# Patient Record
Sex: Female | Born: 1960
Health system: Southern US, Community
[De-identification: ages and names within clinical notes are randomized; demographics above are authoritative.]

## PROBLEM LIST (undated history)

## (undated) DIAGNOSIS — G4730 Sleep apnea, unspecified: Secondary | ICD-10-CM

## (undated) DIAGNOSIS — I10 Essential (primary) hypertension: Secondary | ICD-10-CM

## (undated) DIAGNOSIS — M1990 Unspecified osteoarthritis, unspecified site: Secondary | ICD-10-CM

## (undated) DIAGNOSIS — J302 Other seasonal allergic rhinitis: Secondary | ICD-10-CM

## (undated) DIAGNOSIS — J45909 Unspecified asthma, uncomplicated: Secondary | ICD-10-CM

## (undated) HISTORY — PX: OTHER SURGICAL HISTORY: SHX169

---

## 2014-04-15 ENCOUNTER — Other Ambulatory Visit: Payer: Self-pay | Admitting: Family Medicine

## 2014-04-15 DIAGNOSIS — Z1231 Encounter for screening mammogram for malignant neoplasm of breast: Secondary | ICD-10-CM

## 2014-06-03 ENCOUNTER — Emergency Department (HOSPITAL_COMMUNITY)
Admission: EM | Admit: 2014-06-03 | Discharge: 2014-06-03 | Disposition: A | Discharge status: Home / Self Care | Payer: Federal, State, Local not specified - PPO | Attending: Emergency Medicine | Admitting: Emergency Medicine

## 2014-06-03 DIAGNOSIS — R202 Paresthesia of skin: Secondary | ICD-10-CM | POA: Insufficient documentation

## 2014-06-03 DIAGNOSIS — Z79899 Other long term (current) drug therapy: Secondary | ICD-10-CM | POA: Diagnosis not present

## 2014-06-03 DIAGNOSIS — R42 Dizziness and giddiness: Secondary | ICD-10-CM

## 2014-06-03 LAB — CBG MONITORING, ED: Glucose-Capillary: 81 mg/dL (ref 70–99)
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
## 2014-06-03 NOTE — ED Notes (Addendum)
Pt states started having tingling in her lips and became dizzy this morning.  No chest pains.  Hx of multiple food allergies.  Pt had not eaten yet this morning.  No changes in swallowing or breathing.

## 2014-06-03 NOTE — Discharge Instructions (Signed)
Dizziness °Dizziness is a common problem. It is a feeling of unsteadiness or light-headedness. You may feel like you are about to faint. Dizziness can lead to injury if you stumble or fall. A person of any age group can suffer from dizziness, but dizziness is more common in older adults. °CAUSES  °Dizziness can be caused by many different things, including: °· Middle ear problems. °· Standing for too long. °· Infections. °· An allergic reaction. °· Aging. °· An emotional response to something, such as the sight of blood. °· Side effects of medicines. °· Tiredness. °· Problems with circulation or blood pressure. °· Excessive use of alcohol or medicines, or illegal drug use. °· Breathing too fast (hyperventilation). °· An irregular heart rhythm (arrhythmia). °· A low red blood cell count (anemia). °· Pregnancy. °· Vomiting, diarrhea, fever, or other illnesses that cause body fluid loss (dehydration). °· Diseases or conditions such as Parkinson's disease, high blood pressure (hypertension), diabetes, and thyroid problems. °· Exposure to extreme heat. °DIAGNOSIS  °Your health care provider will ask about your symptoms, perform a physical exam, and perform an electrocardiogram (ECG) to record the electrical activity of your heart. Your health care provider may also perform other heart or blood tests to determine the cause of your dizziness. These may include: °· Transthoracic echocardiogram (TTE). During echocardiography, sound waves are used to evaluate how blood flows through your heart. °· Transesophageal echocardiogram (TEE). °· Cardiac monitoring. This allows your health care provider to monitor your heart rate and rhythm in real time. °· Holter monitor. This is a portable device that records your heartbeat and can help diagnose heart arrhythmias. It allows your health care provider to track your heart activity for several days if needed. °· Stress tests by exercise or by giving medicine that makes the heart beat  faster. °TREATMENT  °Treatment of dizziness depends on the cause of your symptoms and can vary greatly. °HOME CARE INSTRUCTIONS  °· Drink enough fluids to keep your urine clear or pale yellow. This is especially important in very hot weather. In older adults, it is also important in cold weather. °· Take your medicine exactly as directed if your dizziness is caused by medicines. When taking blood pressure medicines, it is especially important to get up slowly. °· Rise slowly from chairs and steady yourself until you feel okay. °· In the morning, first sit up on the side of the bed. When you feel okay, stand slowly while holding onto something until you know your balance is fine. °· Move your legs often if you need to stand in one place for a long time. Tighten and relax your muscles in your legs while standing. °· Have someone stay with you for 1-2 days if dizziness continues to be a problem. Do this until you feel you are well enough to stay alone. Have the person call your health care provider if he or she notices changes in you that are concerning. °· Do not drive or use heavy machinery if you feel dizzy. °· Do not drink alcohol. °SEEK IMMEDIATE MEDICAL CARE IF:  °· Your dizziness or light-headedness gets worse. °· You feel nauseous or vomit. °· You have problems talking, walking, or using your arms, hands, or legs. °· You feel weak. °· You are not thinking clearly or you have trouble forming sentences. It may take a friend or family member to notice this. °· You have chest pain, abdominal pain, shortness of breath, or sweating. °· Your vision changes. °· You notice   any bleeding.  You have side effects from medicine that seems to be getting worse rather than better. MAKE SURE YOU:   Understand these instructions.  Will watch your condition.  Will get help right away if you are not doing well or get worse. Document Released: 07/06/2000 Document Revised: 01/15/2013 Document Reviewed: 07/30/2010 [facility]  Patient Information 2015 [place], [place]. This information is not intended to replace advice given to you by your health care provider. Make sure you discuss any questions you have with your health care provider. Paresthesia Paresthesia is an abnormal burning or prickling sensation. This sensation is generally felt in the hands, arms, legs, or feet. However, it may occur in any part of the body. It is usually not painful. The feeling may be described as:  Tingling or numbness.  "Pins and needles."  Skin crawling.  Buzzing.  Limbs "falling asleep."  Itching. Most people experience temporary (transient) paresthesia at some time in their lives. CAUSES  Paresthesia may occur when you breathe too quickly (hyperventilation). It can also occur without any apparent cause. Commonly, paresthesia occurs when pressure is placed on a nerve. The feeling quickly goes away once the pressure is removed. For some people, however, paresthesia is a long-lasting (chronic) condition caused by an underlying disorder. The underlying disorder may be:  A traumatic, direct injury to nerves. Examples include a:  Broken (fractured) neck.  Fractured skull.  A disorder affecting the brain and spinal cord (central nervous system). Examples include:  Transverse myelitis.  Encephalitis.  Transient ischemic attack.  Multiple sclerosis.  Stroke.  Tumor or blood vessel problems, such as an arteriovenous malformation pressing against the brain or spinal cord.  A condition that damages the peripheral nerves (peripheral neuropathy). Peripheral nerves are not part of the brain and spinal cord. These conditions include:  Diabetes.  Peripheral vascular disease.  Nerve entrapment syndromes, such as carpal tunnel syndrome.  Shingles.  Hypothyroidism.  Vitamin B12 deficiencies.  Alcoholism.  Heavy metal poisoning (lead, arsenic).  Rheumatoid arthritis.  Systemic lupus erythematosus. DIAGNOSIS  Your  caregiver will attempt to find the underlying cause of your paresthesia. Your caregiver may:  Take your medical history.  Perform a physical exam.  Order various lab tests.  Order imaging tests. TREATMENT  Treatment for paresthesia depends on the underlying cause. HOME CARE INSTRUCTIONS  Avoid drinking alcohol.  You may consider massage or acupuncture to help relieve your symptoms.  Keep all follow-up appointments as directed by your caregiver. SEEK IMMEDIATE MEDICAL CARE IF:   You feel weak.  You have trouble walking or moving.  You have problems with speech or vision.  You feel confused.  You cannot control your bladder or bowel movements.  You feel numbness after an injury.  You faint.  Your burning or prickling feeling gets worse when walking.  You have pain, cramps, or dizziness.  You develop a rash. MAKE SURE YOU:  Understand these instructions.  Will watch your condition.  Will get help right away if you are not doing well or get worse. Document Released: 12/31/2001 Document Revised: 04/04/2011 Document Reviewed: 10/01/2010 [facility] Patient Information 2015 [place]Care, [place]LLC. This information is not intended to replace advice given to you by your health care provider. Make sure you discuss any questions you have with your health care provider.  Emergency Department Resource Guide 1) Find a Doctor and Pay Out of Pocket Although you won't have to find out who is covered by your insurance plan, it is a good idea to ask  around and get recommendations. You will then need to call the office and see if the doctor you have chosen will accept you as a new patient and what types of options they offer for patients who are self-pay. Some doctors offer discounts or will set up payment plans for their patients who do not have insurance, but you will need to ask so you aren't surprised when you get to your appointment.  2) Contact Your Local Health Department Not all  health departments have doctors that can see patients for sick visits, but many do, so it is worth a call to see if yours does. If you don't know where your local health department is, you can check in your phone book. The CDC also has a tool to help you locate your state's health department, and many state websites also have listings of all of their local health departments.  3) Find a Walk-in Clinic If your illness is not likely to be very severe or complicated, you may want to try a walk in clinic. These are popping up all over the country in pharmacies, drugstores, and shopping centers. They're usually staffed by nurse practitioners or physician assistants that have been trained to treat common illnesses and complaints. They're usually fairly quick and inexpensive. However, if you have serious medical issues or chronic medical problems, these are probably not your best option.  No Primary Care Doctor: - Call Health Connect at  [phone] - they can help you locate a primary care doctor that  accepts your insurance, provides certain services, etc. - Physician Referral Service- [phone]  Chronic Pain Problems: Organization         Address  Phone   Notes  [facility]  [phone] Patients need to be referred by their primary care doctor.   Medication Assistance: Organization         Address  Phone   Notes  [facility] Medication [facility] [address] [place], [place] [address] [phone] --Must be a resident of [place] -- Must have NO insurance coverage whatsoever (no Medicaid/ Medicare, etc.) -- The pt. MUST have a primary care doctor that directs their care regularly and follows them in the community   Med[facility]ist  [phone]   [employer]  [phone]) [phone]    Agencies that provide inexpensive medical care: Organization         Address  Phone   Notes  [facility]  [phone]   [facility]     [phone]   [facility] [address] [place], [place] [address] [phone]   [facility] [address], [place] [phone]) [phone]   [facility]    [phone]   [facility]    [phone]   [facility]  [address], [place] Phone:  [phone], Fax:  [phone] Hours of Operation:  9 am - 6 pm, M-F.  Also accepts Medicaid/Medicare and self-pay.  [facility]  [address], [place] Phone: [phone], Fax: [phone]. Hours of Operation:  8:30 am - 5:30 pm, M-F.  Also accepts Medicaid and self-pay.  [facility] [place] [address], [place] Phone: [phone]) [phone]   [facility] [address], [place], [place] [phone], Ext. [phone] Mondays & Thursdays: 7-9 AM.  First 15 patients are seen  on a first come, first serve basis.    Medicaid-accepting [facility] Providers:  Organization         Address  Phone   Notes  [facility] [address], [place] [phone] Also accepts self-pay patients.  [facility] [address] Laurell Josephs [place], [place]  [phone]   [facility] [address], [place] [phone]   [facility] [address] [place] [phone]   Renaye Rakers [address], [place]   [phone] Only accepts [place] Access [place] patients after they have their name applied to their card.   Self-Pay (no insurance) in [facility]:  Organization         Address  Phone   Notes  Sickle Cell Patients, [facility] [address] [place], [place] [phone]   [facility] [address] [place], [place] [phone]   [facility] [place]  [address], [place] [phone]     [facility]/Dr. Osei-Bonsu  [address], [place] or [address], [place] [phone] Phone number for both [place] and [place] locations is the same.  [facility] [address], [place] [phone]   [facility] [address], [place] or [address] [phone]   [facility] [address], [place] [phone], phone; [phone], fax Sees patients 1st and 3rd Saturday of every month.  Must not qualify for public or private insurance (i.e. Medicaid, Medicare, [place] Health Choice, Veterans' Benefits)  Household income should be no more than 200% of the poverty level The clinic cannot treat you if you are pregnant or think you are pregnant  Sexually transmitted diseases are not treated at the clinic.    Dental Care: Organization         Address  Phone  Notes  [facility] Department of [facility] [address] [place], [place] [phone] Accepts children up to age 1 who are enrolled in [place] or [place] Health Choice; pregnant women with a Medicaid card; and children who have applied for Medicaid or [place] Health Choice, but were declined, whose parents can pay a reduced fee at time of service.  [facility] of [facility]  [address], [place] [phone] Accepts children up to age 74 who are enrolled in [place] or [place] Health Choice; pregnant women with a Medicaid card; and children who have applied for Medicaid or [place] Health Choice, but were declined, whose parents can pay a reduced fee at time of service.  [facility]  [address] [place], [place] [phone] Patients are seen by appointment only. Walk-ins are not accepted. [facility] will see patients 56 years of age and older. Monday - Tuesday (8am-5pm) Most Wednesdays (8:30-5pm) $30 per visit,  cash only  [facility] Adult Dental Access PROGRAM  [address], [facility] [phone] Patients are seen by appointment only. Walk-ins are not accepted. [facility] will see patients 98 years of age and older. One Wednesday Evening (Monthly: Volunteer Based).  $30 per visit, cash only  [employer]  [phone] for adults; Children under age 23, call Graduate Pediatric Dentistry at [phone])  [phone]. Children aged 44-14, please call [phone] to request a pediatric application.  Dental services are provided in all areas of dental care including fillings, crowns and bridges, complete and partial dentures, implants, gum treatment, root canals, and extractions. Preventive care is also provided. Treatment is provided to both adults and children. Patients are selected via a lottery and there is often a waiting list.   [facility] [address], [place]  [phone] [email or web address]   [facility] [address] [place], [place] [phone], Ext. [phone] Second and Fourth Thursday of each month, opens at 6:30 AM; Clinic ends at 9 AM.  Patients are seen on a first-come first-served basis, and a limited number are seen during each clinic.   [facility]  [address] Ether Griffins [place], [place] [phone]   Eligibility Requirements You must have lived in [place], [place], or [place] counties for at least the last three months.   You cannot be eligible for state or federal sponsored [place], including [employer], [place], or [employer].   You generally cannot be eligible for healthcare insurance through your employer.    How to apply: Eligibility screenings are held every Tuesday and Wednesday afternoon from 1:00 pm until 4:00 pm. You do not need an appointment for the interview!  [facility] [address], [place], [place] [phone]   [facility] Health Department   [phone]   [facility]  [phone]   [facility] Health Department  [phone]    Behavioral Health Resources in the Community: Intensive Outpatient Programs Organization         Address  Phone  Notes  [facility] Services [address] [place], [place] [phone]   [facility] Outpatient [address] [place], [place] [phone]   [facility] [address], [place], [place]  [phone]   [facility] [address],  [place], [place] [phone] or [phone]   Substance Abuse Resources Organization         Address  Phone  Notes  [facility] Drug Services  [phone]   [facility]  [phone]   [facility] [place]  [phone]   Floydene Flock  [phone]   Residential & Outpatient Substance Abuse Program  [phone]   Psychological Services Organization         Address  Phone  Notes  [facility] Health  [phone]   [facility] Services  [phone]   [facility] Mental Health [address] [place] [phone] or [phone]    Mobile Crisis Teams Organization         Address  Phone  Notes  [facility], Mobile Crisis Care Unit  [phone]   [facility]  [address] [place], [place] [phone]   Doristine Locks [address] [place] [phone]    Self-Help/Support Groups Organization         Address  Phone             Notes  [facility] [place] - variety of support groups  [phone]- I7437963 Call for more information  Narcotics Anonymous (NA), [facility] [address], [employer] [place]  2 meetings at this location   Statistician         Address  Phone  Notes  [facility] [address],    [place]  [phone]  [facility]  [address], [place] [phone], [place], [place] [phone]    [facility] [address], [place] [phone] Admissions: 8am-3pm M-F  [facility] [address]    [place], [place] [phone]   [facility] [address], [place], [place] [phone]   [facility] [address]  [place], [place] [phone]   [facility] - Intensive Outpatient [address], [place], [place] [phone]   [facility] ([facility] [address]  [place], [place] [phone] or [phone]   [facility] ([facility]) [address] [place], [place] [phone] Accepts Medicaid  [facility] [address]  [place] [phone] Substance Abuse/Addiction Treatment   [facility] Behavioral Health Resources Organization         Address  Phone  Notes  [facility]  [phone]   Angie FavaJulie Brannon, PhD [address] [place], [place]   [phone](336) [phone] or [phone]   [facility]   [address] [place], [place] [phone]) [phone]   [facility] [address], [place], [place] [phone]) [phone] Insurance/Medicaid/sponsorship through [facility]  [facility] [address]                                    [place], [place] [phone]) [phone] Therapy/tele-psych/case  [facility] [address]   [place], [place] [phone]) [phone]    Dr. Lolly MustacheArfeen  [phone]   [facility]  [employer] [facility] 1) [address], [place] 2) [address], [place] 3)  [address], [place] [phone](336) [phone]  [phone]   [facility] Child Abuse Hotline [phone]) 2208446108 or [phone](336) [phone] (After Hours)

## 2014-12-09 ENCOUNTER — Encounter: Payer: Self-pay | Admitting: Family Medicine

## 2015-01-01 ENCOUNTER — Ambulatory Visit: Payer: Federal, State, Local not specified - PPO | Admitting: Podiatry

## 2015-01-15 ENCOUNTER — Ambulatory Visit (INDEPENDENT_AMBULATORY_CARE_PROVIDER_SITE_OTHER): Payer: Federal, State, Local not specified - PPO

## 2015-01-15 ENCOUNTER — Ambulatory Visit (INDEPENDENT_AMBULATORY_CARE_PROVIDER_SITE_OTHER): Payer: Federal, State, Local not specified - PPO | Admitting: Podiatry

## 2015-01-15 ENCOUNTER — Encounter: Payer: Self-pay | Admitting: Podiatry

## 2015-01-15 VITALS — BP 139/78 | HR 72 | Resp 16

## 2015-01-15 DIAGNOSIS — M2042 Other hammer toe(s) (acquired), left foot: Secondary | ICD-10-CM | POA: Diagnosis not present

## 2015-01-15 DIAGNOSIS — M2012 Hallux valgus (acquired), left foot: Secondary | ICD-10-CM | POA: Diagnosis not present

## 2015-01-15 DIAGNOSIS — M898X9 Other specified disorders of bone, unspecified site: Secondary | ICD-10-CM | POA: Diagnosis not present

## 2015-01-15 NOTE — Progress Notes (Signed)
   Subjective:    Patient ID: Breanna Jones, female    DOB: 09-Sep-1960, 54 y.o.   MRN: [id number]  HPI: She presents today with a chief complaint of a cyst of the dorsal aspect of her right foot and a bunion to her left foot. She states that it was really bothering her the other day and she refers to her right foot was really swollen and red and then has since gone down and feels much better. She states that the bunion seems to be getting worse as time goes on and is becoming more painful with shoe gear.    Review of Systems  Musculoskeletal: Positive for back pain.  Skin: Positive for rash.  Allergic/Immunologic: Positive for food allergies.  All other systems reviewed and are negative.      Objective:   Physical Exam: She presents today as a 54 year old female in no apparent distress vital signs stable alert and oriented 3. Pulses are strongly palpable. Neurologic sensorium is intact per Semmes-Weinstein monofilament. Deep tendon reflexes are intact bilateral and muscle strength +5 over 5 dorsiflexion and plantar flexors and inverters everters all intrinsic musculature is intact. Evaluation of the right foot does demonstrate a small cyst to the dorsal aspect of Lisfranc's joints right foot. This is freely movable and is very small less than 1 cm in diameter. It is nontender on palpation there is no erythema and there is no warmth to it. Radiographs do not demonstrate any type of osseus abnormalities within this area. Left foot does demonstrate mild hallux abductovalgus deformity of the left foot with hammertoe deformity second left. She gets a small irritation between these toes. There is mild tenderness on palpation of the first metatarsophalangeal joint and radiographs confirm hallux abductovalgus deformity and hammertoe deformity. No open lesions or wounds are noted.        Assessment & Plan:  Assessment: Ganglion cyst dorsal aspect right foot. Left foot demonstrates a bunion with mild  hammertoe deformity second.  Plan: Discussed etiology pathology conservative versus surgical therapies. We discussed the possible need for future surgeries at both sites. She understands this is amenable to it and I will follow-up with her as needed.

## 2015-03-26 ENCOUNTER — Encounter: Payer: Self-pay | Admitting: Family Medicine

## 2015-09-02 ENCOUNTER — Other Ambulatory Visit (HOSPITAL_COMMUNITY)
Admission: RE | Admit: 2015-09-02 | Discharge: 2015-09-02 | Disposition: A | Discharge status: Home / Self Care | Payer: Federal, State, Local not specified - PPO | Source: Ambulatory Visit | Attending: Obstetrics and Gynecology | Admitting: Obstetrics and Gynecology

## 2015-09-02 ENCOUNTER — Other Ambulatory Visit: Payer: Self-pay | Admitting: Obstetrics and Gynecology

## 2015-09-02 DIAGNOSIS — Z1151 Encounter for screening for human papillomavirus (HPV): Secondary | ICD-10-CM | POA: Diagnosis present

## 2015-09-02 DIAGNOSIS — Z01419 Encounter for gynecological examination (general) (routine) without abnormal findings: Secondary | ICD-10-CM | POA: Insufficient documentation

## 2015-09-04 LAB — CYTOLOGY - PAP

## 2015-12-15 ENCOUNTER — Encounter (HOSPITAL_COMMUNITY): Payer: Self-pay | Admitting: Emergency Medicine

## 2015-12-15 ENCOUNTER — Emergency Department (HOSPITAL_COMMUNITY)
Admission: EM | Admit: 2015-12-15 | Discharge: 2015-12-15 | Disposition: A | Discharge status: Home / Self Care | Payer: Federal, State, Local not specified - PPO | Attending: Emergency Medicine | Admitting: Emergency Medicine

## 2015-12-15 ENCOUNTER — Emergency Department (HOSPITAL_COMMUNITY): Payer: Federal, State, Local not specified - PPO

## 2015-12-15 DIAGNOSIS — Y929 Unspecified place or not applicable: Secondary | ICD-10-CM | POA: Diagnosis not present

## 2015-12-15 DIAGNOSIS — J45909 Unspecified asthma, uncomplicated: Secondary | ICD-10-CM | POA: Diagnosis not present

## 2015-12-15 DIAGNOSIS — W08XXXA Fall from other furniture, initial encounter: Secondary | ICD-10-CM | POA: Diagnosis not present

## 2015-12-15 DIAGNOSIS — W19XXXA Unspecified fall, initial encounter: Secondary | ICD-10-CM

## 2015-12-15 DIAGNOSIS — M545 Low back pain: Secondary | ICD-10-CM | POA: Insufficient documentation

## 2015-12-15 DIAGNOSIS — Y939 Activity, unspecified: Secondary | ICD-10-CM | POA: Diagnosis not present

## 2015-12-15 DIAGNOSIS — Y999 Unspecified external cause status: Secondary | ICD-10-CM | POA: Insufficient documentation

## 2015-12-15 DIAGNOSIS — Z91010 Allergy to peanuts: Secondary | ICD-10-CM | POA: Diagnosis not present

## 2015-12-15 HISTORY — DX: Unspecified asthma, uncomplicated: J45.909

## 2015-12-15 MED ORDER — MELOXICAM 7.5 MG PO TABS: 7.5000 mg | ORAL_TABLET | Freq: Every day | ORAL | 0 refills | Status: DC

## 2015-12-15 MED ORDER — METHOCARBAMOL 500 MG PO TABS: 500.0000 mg | ORAL_TABLET | Freq: Two times a day (BID) | ORAL | 0 refills | Status: DC

## 2015-12-15 MED ORDER — LIDOCAINE 5 % EX PTCH: 1.0000 | MEDICATED_PATCH | CUTANEOUS | 0 refills | Status: DC

## 2015-12-15 MED ORDER — METHOCARBAMOL 500 MG PO TABS
1000.0000 mg | ORAL_TABLET | Freq: Once | ORAL | Status: AC
  Administered 2015-12-15: 1000 mg via ORAL
  Filled 2015-12-15: qty 2

## 2015-12-15 MED ORDER — NAPROXEN 500 MG PO TABS
500.0000 mg | ORAL_TABLET | Freq: Once | ORAL | Status: AC
  Administered 2015-12-15: 500 mg via ORAL
  Filled 2015-12-15: qty 1

## 2015-12-15 NOTE — ED Triage Notes (Signed)
Pt c/o lower back pain related to her falling backwards off a bench she was sitting on; states she hit her head but does not have any head pain, LOC, or neurological complaints; ambulatory in triage

## 2015-12-15 NOTE — ED Provider Notes (Signed)
[facility]-EMERGENCY DEPT Provider Note   CSN: [id number] Arrival date & time: 12/15/15  0210    By signing my name below, I, Octavia Heirrianna Nassar, attest that this documentation has been prepared under the direction and in the presence of Lasean Rahming, MD.  Electronically Signed: Octavia HeirArianna Nassar, ED Scribe. 12/15/15. 2:44 AM.     History   Chief Complaint Chief Complaint  Patient presents with  . Fall  . Back Pain    The history is provided by the patient. No language interpreter was used.  Fall  This is a new problem. The current episode started 2 days ago. The problem occurs rarely. The problem has been gradually worsening. Pertinent negatives include no headaches. The symptoms are aggravated by walking and twisting. Nothing relieves the symptoms. She has tried acetaminophen and a warm compress for the symptoms. The treatment provided no relief.  Back Pain   This is a new problem. The current episode started 2 days ago. The problem occurs rarely. The problem has been gradually worsening. The pain is associated with falling. The pain is present in the lumbar spine. The pain does not radiate. The pain is moderate. The symptoms are aggravated by bending and certain positions. Pertinent negatives include no numbness, no headaches and no weakness. She has tried heat for the symptoms. The treatment provided no relief. Risk factors include menopause.   HPI Comments: Breanna Jones is a 55 y.o. female who presents to the Emergency Department complaining of sudden onset, gradual worsening lower back pain s/p a fall that occurred two days ago. She reports falling asleep and fell backward on a bench. Pt expresses that certain movements and ambulating exacerbates her pain. She has been taking motrin and applying ice to alleviate her pain without relief. Denies difficulty urinating, constipation, or any other complaints.  Past Medical History:  Diagnosis Date  . Asthma     There are no active problems to  display for this patient.   Past Surgical History:  Procedure Laterality Date  . bunion removal    . CESAREAN SECTION      OB History    No data available       Home Medications    Prior to Admission medications   Medication Sig Start Date End Date Taking? Authorizing Provider  benzonatate (TESSALON) 100 MG capsule TAKE 1 TO 2 CAPSULES BY MOUTH 3 TIMES A DAY 10/11/14   Historical Provider, MD  fluticasone (FLONASE) 50 MCG/ACT nasal spray USE 1 SPRAY IN EACH NOSTRIL AS DIRECTED 10/11/14   Historical Provider, MD  montelukast (SINGULAIR) 10 MG tablet Take 1 tablet by mouth daily. 04/22/14   Historical Provider, MD  QVAR 80 MCG/ACT inhaler Inhale 1 puff into the lungs 2 (two) times daily. 12/24/14   Historical Provider, MD  ranitidine (ZANTAC) 150 MG capsule Take 150 mg by mouth 2 (two) times daily. 12/13/14   Historical Provider, MD  VENTOLIN HFA 108 (90 BASE) MCG/ACT inhaler Inhale 2 puffs into the lungs every 4 (four) hours as needed. Shortness of breath 04/22/14   Historical Provider, MD    Family History No family history on file.  Social History Social History  Substance Use Topics  . Smoking status: Never Smoker  . Smokeless tobacco: Never Used  . Alcohol use 0.0 oz/week     Allergies   Other; Peanuts [peanut oil]; and Shellfish allergy   Review of Systems Review of Systems  Genitourinary: Negative for difficulty urinating.  Musculoskeletal: Positive for back pain. Negative for gait  problem, neck pain and neck stiffness.  Neurological: Negative for weakness, numbness and headaches.  All other systems reviewed and are negative.    Physical Exam Updated Vital Signs BP 153/70 (BP Location: Left Arm)   Pulse 67   Temp 97.9 F (36.6 C) (Oral)   Resp 16   SpO2 96%   Physical Exam  Constitutional: She is oriented to person, place, and time. She appears well-developed and well-nourished.  HENT:  Head: Normocephalic and atraumatic.  Mouth/Throat: Oropharynx is  clear and moist. No oropharyngeal exudate.  No battle signs, no raccoon eyes  Eyes: Conjunctivae and EOM are normal. Pupils are equal, round, and reactive to light.  Neck: Normal range of motion. Neck supple. No JVD present. No tracheal deviation present.  No carotid bruits. Trachea midline.   Cardiovascular: Normal rate, regular rhythm and normal heart sounds.  Exam reveals no gallop and no friction rub.   No murmur heard. RRR.   Pulmonary/Chest: Effort normal and breath sounds normal. No stridor. No respiratory distress. She has no wheezes. She has no rales.  Lungs CTA bilaterally.   Abdominal: Soft. Bowel sounds are normal. She exhibits no distension. There is no rebound and no guarding.  Musculoskeletal: Normal range of motion. She exhibits no tenderness.       Right hip: Normal.       Left hip: Normal.       Cervical back: Normal.       Thoracic back: Normal.       Lumbar back: Normal.  No step-offs, crepitus or tenderness to CTL spine  Lymphadenopathy:    She has no cervical adenopathy.  Neurological: She is alert and oriented to person, place, and time. She has normal reflexes.  Skin: Skin is warm and dry.  Psychiatric: She has a normal mood and affect.  Nursing note and vitals reviewed.    ED Treatments / Results   Vitals:   12/15/15 0215  BP: 153/70  Pulse: 67  Resp: 16  Temp: 97.9 F (36.6 C)   Results for orders placed or performed in visit on 09/02/15  Cytology - PAP  Result Value Ref Range   CYTOLOGY - PAP PAP RESULT    Dg Sacrum/coccyx  Result Date: 12/15/2015 CLINICAL DATA:  Patient fell backwards on 12/12/2015. Increasing pain in the coccyx since then. EXAM: SACRUM AND COCCYX - 2+ VIEW COMPARISON:  None. FINDINGS: There is no evidence of fracture or other focal bone lesions. Sacral struts appear intact. SI joints are symmetrical. Normal alignment of the sacral coccygeal spine. Calcifications in the pelvis consistent with calcified uterine fibroids.  IMPRESSION: No acute bony abnormalities. Electronically Signed   By: Burman NievesWilliam  Stevens M.D.   On: 12/15/2015 03:08     DIAGNOSTIC STUDIES: Oxygen Saturation is 96% on RA, normal by my interpretation.  COORDINATION OF CARE:  2:42 AM Discussed treatment plan with pt at bedside and pt agreed to plan.    Procedures Procedures (including critical care time)  Medications Ordered in ED  Medications  naproxen (NAPROSYN) tablet 500 mg (500 mg Oral Given 12/15/15 0336)  methocarbamol (ROBAXIN) tablet 1,000 mg (1,000 mg Oral Given 12/15/15 0336)     Final Clinical Impressions(s) / ED Diagnoses  Fall with pain: No fractures or dislocations.  Non tender no bowel or bladder symptoms.  Stable for discharge.  All questions answered to patient's satisfaction. Based on history and exam patient has been appropriately medically screened and emergency conditions excluded. Patient is stable for discharge at this  time. Follow up with your PMD for recheck in 2 days and strict return precautions given.     New Prescriptions New Prescriptions   No medications on file     Yaniyah Koors, MD 12/15/15 [phone]

## 2016-04-14 DIAGNOSIS — R238 Other skin changes: Secondary | ICD-10-CM | POA: Diagnosis not present

## 2016-04-14 DIAGNOSIS — J4520 Mild intermittent asthma, uncomplicated: Secondary | ICD-10-CM | POA: Diagnosis not present

## 2016-04-14 DIAGNOSIS — K219 Gastro-esophageal reflux disease without esophagitis: Secondary | ICD-10-CM | POA: Diagnosis not present

## 2016-04-14 DIAGNOSIS — G4733 Obstructive sleep apnea (adult) (pediatric): Secondary | ICD-10-CM | POA: Diagnosis not present

## 2016-05-18 DIAGNOSIS — M545 Low back pain: Secondary | ICD-10-CM | POA: Diagnosis not present

## 2016-05-27 DIAGNOSIS — M4696 Unspecified inflammatory spondylopathy, lumbar region: Secondary | ICD-10-CM | POA: Diagnosis not present

## 2016-05-27 DIAGNOSIS — M545 Low back pain: Secondary | ICD-10-CM | POA: Diagnosis not present

## 2016-06-03 DIAGNOSIS — M545 Low back pain: Secondary | ICD-10-CM | POA: Diagnosis not present

## 2016-06-10 DIAGNOSIS — M545 Low back pain: Secondary | ICD-10-CM | POA: Diagnosis not present

## 2016-10-17 DIAGNOSIS — R0602 Shortness of breath: Secondary | ICD-10-CM | POA: Diagnosis not present

## 2016-10-17 DIAGNOSIS — Z79899 Other long term (current) drug therapy: Secondary | ICD-10-CM | POA: Diagnosis not present

## 2016-10-17 DIAGNOSIS — Z114 Encounter for screening for human immunodeficiency virus [HIV]: Secondary | ICD-10-CM | POA: Diagnosis not present

## 2016-10-17 DIAGNOSIS — R5383 Other fatigue: Secondary | ICD-10-CM | POA: Diagnosis not present

## 2016-10-17 DIAGNOSIS — Z1389 Encounter for screening for other disorder: Secondary | ICD-10-CM | POA: Diagnosis not present

## 2016-10-17 DIAGNOSIS — R8290 Unspecified abnormal findings in urine: Secondary | ICD-10-CM | POA: Diagnosis not present

## 2016-10-17 DIAGNOSIS — Z0000 Encounter for general adult medical examination without abnormal findings: Secondary | ICD-10-CM | POA: Diagnosis not present

## 2016-10-17 DIAGNOSIS — Z131 Encounter for screening for diabetes mellitus: Secondary | ICD-10-CM | POA: Diagnosis not present

## 2016-10-18 DIAGNOSIS — M25571 Pain in right ankle and joints of right foot: Secondary | ICD-10-CM | POA: Diagnosis not present

## 2016-10-18 DIAGNOSIS — S93401A Sprain of unspecified ligament of right ankle, initial encounter: Secondary | ICD-10-CM | POA: Diagnosis not present

## 2016-11-15 DIAGNOSIS — J4520 Mild intermittent asthma, uncomplicated: Secondary | ICD-10-CM | POA: Insufficient documentation

## 2016-11-15 DIAGNOSIS — J45901 Unspecified asthma with (acute) exacerbation: Secondary | ICD-10-CM | POA: Insufficient documentation

## 2016-11-15 DIAGNOSIS — M545 Low back pain, unspecified: Secondary | ICD-10-CM | POA: Insufficient documentation

## 2016-11-15 DIAGNOSIS — G8929 Other chronic pain: Secondary | ICD-10-CM | POA: Diagnosis not present

## 2016-11-15 DIAGNOSIS — K219 Gastro-esophageal reflux disease without esophagitis: Secondary | ICD-10-CM | POA: Insufficient documentation

## 2016-11-15 DIAGNOSIS — M5126 Other intervertebral disc displacement, lumbar region: Secondary | ICD-10-CM | POA: Diagnosis not present

## 2016-12-28 DIAGNOSIS — J309 Allergic rhinitis, unspecified: Secondary | ICD-10-CM | POA: Diagnosis not present

## 2016-12-28 DIAGNOSIS — E559 Vitamin D deficiency, unspecified: Secondary | ICD-10-CM | POA: Diagnosis not present

## 2016-12-28 DIAGNOSIS — J4520 Mild intermittent asthma, uncomplicated: Secondary | ICD-10-CM | POA: Diagnosis not present

## 2016-12-28 DIAGNOSIS — K219 Gastro-esophageal reflux disease without esophagitis: Secondary | ICD-10-CM | POA: Diagnosis not present

## 2017-02-01 ENCOUNTER — Ambulatory Visit: Payer: Federal, State, Local not specified - PPO | Attending: Family Medicine

## 2017-02-01 DIAGNOSIS — R030 Elevated blood-pressure reading, without diagnosis of hypertension: Secondary | ICD-10-CM | POA: Diagnosis not present

## 2017-02-01 DIAGNOSIS — M545 Low back pain: Secondary | ICD-10-CM | POA: Insufficient documentation

## 2017-02-01 DIAGNOSIS — G8929 Other chronic pain: Secondary | ICD-10-CM | POA: Insufficient documentation

## 2017-02-01 NOTE — Therapy (Signed)
[facility] [address] [place], [place], [address] Phone: [phone]   Fax:  [phone]  Patient Details  Name: Breanna Jones MRN: [id number] Date of Birth: 02-Dec-1960 Referring Provider:  Henrine Screwshacker, Robert, MD  Encounter Date: 02/01/2017       Today Breanna Jones presented for an FCE and was tolerating the initial testing but her initial BP exceeded the protocol limit of 150/100 to proceed with the FCE testing.   I usually initiate the lifting and stairs part of the testing and assess if BP decreases due to initial anxiety and we did this  but a reduction  did not occur as her BP was as high as 185/95.  She will contact you for follow up.   Her mother died in past week and this could be part of her situation and she may improve over time but we cannot do the testing until her BP is around this 150/100 level. I will need a medical clearance for her to return for the FCE in future.  Sorry for this inconvenience.   Thanks for referring her.                                                                                   Caprice RedChasse, Corinthian Mizrahi M  PT 02/01/2017, 8:54 AM  [facility] [address] [place], [place], [address] Phone: [phone]   Fax:  [phone]

## 2017-02-14 DIAGNOSIS — Z01419 Encounter for gynecological examination (general) (routine) without abnormal findings: Secondary | ICD-10-CM | POA: Diagnosis not present

## 2017-02-14 DIAGNOSIS — Z6835 Body mass index (BMI) 35.0-35.9, adult: Secondary | ICD-10-CM | POA: Diagnosis not present

## 2017-02-22 DIAGNOSIS — I10 Essential (primary) hypertension: Secondary | ICD-10-CM | POA: Diagnosis not present

## 2017-03-20 DIAGNOSIS — Z13228 Encounter for screening for other metabolic disorders: Secondary | ICD-10-CM | POA: Diagnosis not present

## 2017-03-20 DIAGNOSIS — Z1231 Encounter for screening mammogram for malignant neoplasm of breast: Secondary | ICD-10-CM | POA: Diagnosis not present

## 2017-03-20 DIAGNOSIS — Z13220 Encounter for screening for lipoid disorders: Secondary | ICD-10-CM | POA: Diagnosis not present

## 2017-03-20 DIAGNOSIS — Z13820 Encounter for screening for osteoporosis: Secondary | ICD-10-CM | POA: Diagnosis not present

## 2017-03-21 DIAGNOSIS — J4520 Mild intermittent asthma, uncomplicated: Secondary | ICD-10-CM | POA: Diagnosis not present

## 2017-04-05 ENCOUNTER — Ambulatory Visit: Payer: Federal, State, Local not specified - PPO | Attending: Family Medicine

## 2017-04-05 DIAGNOSIS — M545 Low back pain: Secondary | ICD-10-CM | POA: Insufficient documentation

## 2017-04-05 DIAGNOSIS — G8929 Other chronic pain: Secondary | ICD-10-CM | POA: Diagnosis not present

## 2017-04-05 NOTE — Therapy (Signed)
[facility] [address] [place], [place], [address] Phone: [phone]   Fax:  [phone]  Physical Therapy Evaluation  Patient Details  Name: Breanna Jones MRN: [id number] Date of Birth: November 07, 1960 Referring Provider: Henrine Screwsobert Thacker, MD   Encounter Date: 04/05/2017  PT End of Session - 04/05/17 0711    Visit Number  1    Number of Visits  1    Authorization Type  BC[employer]    PT Start Time  0712    PT Stop Time  1030    PT Time Calculation (min)  198 min    Activity Tolerance  Patient tolerated treatment well;Patient limited by pain    Behavior During Therapy  [facility] for tasks assessed/performed       Past Medical History:  Diagnosis Date  . Asthma     Past Surgical History:  Procedure Laterality Date  . bunion removal    . CESAREAN SECTION      There were no vitals filed for this visit.   Subjective Assessment - 04/05/17 0715    Subjective  She reports now on BP meds. She was ill from these then she stopped meds and went back to Md and blood pressure was fine.          [facility] Assessment - 04/05/17 0001      Assessment   Medical Diagnosis  chronic LBP with DDD    Referring Provider  Henrine Screwsobert Thacker, MD      Cognition   Overall Cognitive Status  Within Functional Limits for tasks assessed             Objective measurements completed on examination: See above findings.   FCE REPORT WILL BE FAXED TO DR Abigail MiyamotoHACKER AND SCANNED INTO EPIC           PT Education - 04/05/17 [phone]    Education provided  Yes    Education Details  keep active but don't overdo activity for 2-4 days for post testing soreness    Person(s) Educated  Patient    Methods  Explanation    Comprehension  Verbalized understanding                  Plan - 04/05/17 0712    Clinical Impression Statement  Ms [name] BP was high but within protocol limits.   She was rated at light level of work but due to frequency of self  limiting behavior due to pain this is more likely the minimum level of work .   See report     PT Next Visit Plan  FCE results will be faxed to Dr Abigail Miyamotohacker. No follow up    Consulted and Agree with Plan of Care  Patient       Patient will benefit from skilled therapeutic intervention in order to improve the following deficits and impairments:     Visit Diagnosis: Chronic bilateral low back pain, with sciatica presence unspecified     Problem List There are no active problems to display for this patient.   Caprice RedChasse, Zooey Schreurs M  PT 04/05/2017, 10:49 AM  [facility] [address] [place], [place], [address] Phone: [phone]   Fax:  [phone]  Name: Breanna Jones MRN: [id number] Date of Birth: November 07, 1960

## 2017-04-19 DIAGNOSIS — Z1211 Encounter for screening for malignant neoplasm of colon: Secondary | ICD-10-CM | POA: Diagnosis not present

## 2017-04-19 DIAGNOSIS — M545 Low back pain: Secondary | ICD-10-CM | POA: Diagnosis not present

## 2017-04-19 DIAGNOSIS — K219 Gastro-esophageal reflux disease without esophagitis: Secondary | ICD-10-CM | POA: Diagnosis not present

## 2017-04-19 DIAGNOSIS — I10 Essential (primary) hypertension: Secondary | ICD-10-CM | POA: Diagnosis not present

## 2017-06-08 ENCOUNTER — Telehealth: Payer: Self-pay | Admitting: Physical Therapy

## 2017-06-08 NOTE — Telephone Encounter (Signed)
Returned pt call. Asked her to call if able today

## 2017-06-22 DIAGNOSIS — M79605 Pain in left leg: Secondary | ICD-10-CM | POA: Diagnosis not present

## 2017-10-04 DIAGNOSIS — J45901 Unspecified asthma with (acute) exacerbation: Secondary | ICD-10-CM | POA: Diagnosis not present

## 2017-10-17 DIAGNOSIS — J45901 Unspecified asthma with (acute) exacerbation: Secondary | ICD-10-CM | POA: Diagnosis not present

## 2017-10-24 DIAGNOSIS — G8929 Other chronic pain: Secondary | ICD-10-CM | POA: Diagnosis not present

## 2017-10-24 DIAGNOSIS — M545 Low back pain: Secondary | ICD-10-CM | POA: Diagnosis not present

## 2017-12-26 DIAGNOSIS — L309 Dermatitis, unspecified: Secondary | ICD-10-CM | POA: Diagnosis not present

## 2017-12-26 DIAGNOSIS — J4520 Mild intermittent asthma, uncomplicated: Secondary | ICD-10-CM | POA: Diagnosis not present

## 2018-02-09 ENCOUNTER — Encounter (HOSPITAL_BASED_OUTPATIENT_CLINIC_OR_DEPARTMENT_OTHER): Payer: Self-pay | Admitting: *Deleted

## 2018-02-09 ENCOUNTER — Emergency Department (HOSPITAL_BASED_OUTPATIENT_CLINIC_OR_DEPARTMENT_OTHER): Payer: Federal, State, Local not specified - PPO

## 2018-02-09 ENCOUNTER — Emergency Department (HOSPITAL_BASED_OUTPATIENT_CLINIC_OR_DEPARTMENT_OTHER)
Admission: EM | Admit: 2018-02-09 | Discharge: 2018-02-09 | Disposition: A | Discharge status: Home / Self Care | Payer: Federal, State, Local not specified - PPO | Attending: Emergency Medicine | Admitting: Emergency Medicine

## 2018-02-09 DIAGNOSIS — J029 Acute pharyngitis, unspecified: Secondary | ICD-10-CM | POA: Diagnosis not present

## 2018-02-09 DIAGNOSIS — J45909 Unspecified asthma, uncomplicated: Secondary | ICD-10-CM | POA: Insufficient documentation

## 2018-02-09 DIAGNOSIS — R0789 Other chest pain: Secondary | ICD-10-CM | POA: Insufficient documentation

## 2018-02-09 DIAGNOSIS — R509 Fever, unspecified: Secondary | ICD-10-CM | POA: Diagnosis not present

## 2018-02-09 DIAGNOSIS — R221 Localized swelling, mass and lump, neck: Secondary | ICD-10-CM | POA: Diagnosis not present

## 2018-02-09 DIAGNOSIS — J111 Influenza due to unidentified influenza virus with other respiratory manifestations: Secondary | ICD-10-CM

## 2018-02-09 DIAGNOSIS — R079 Chest pain, unspecified: Secondary | ICD-10-CM

## 2018-02-09 LAB — GROUP A STREP BY PCR: Group A Strep by PCR: NOT DETECTED

## 2018-02-09 MED ORDER — OSELTAMIVIR PHOSPHATE 75 MG PO CAPS: 75.0000 mg | ORAL_CAPSULE | Freq: Two times a day (BID) | ORAL | 0 refills | Status: DC

## 2018-02-09 MED ORDER — IBUPROFEN 800 MG PO TABS
ORAL_TABLET | ORAL | Status: AC
  Filled 2018-02-09: qty 1

## 2018-02-09 MED ORDER — IBUPROFEN 800 MG PO TABS
800.0000 mg | ORAL_TABLET | Freq: Once | ORAL | Status: AC
  Administered 2018-02-09: 800 mg via ORAL

## 2018-02-09 MED ORDER — ONDANSETRON 4 MG PO TBDP
4.0000 mg | ORAL_TABLET | Freq: Once | ORAL | Status: AC
  Administered 2018-02-09: 4 mg via ORAL

## 2018-02-09 MED ORDER — ONDANSETRON 4 MG PO TBDP
ORAL_TABLET | ORAL | Status: AC
  Filled 2018-02-09: qty 1

## 2018-02-09 NOTE — ED Notes (Signed)
ED Provider at bedside. 

## 2018-02-09 NOTE — ED Provider Notes (Signed)
[facility] Emergency Department Provider Note MRN:  [id number]  Arrival date & time: 02/09/18     Chief Complaint   Sore Throat   History of Present Illness   Breanna Jones is a 58 y.o. year-old female with a history of asthma presenting to the ED with chief complaint of sore throat.  1 day of general malaise, fever, sore throat, body aches, mild anterior chest tightness feels similar to prior asthma exacerbations.  Denies headache or vision change, no shortness of breath, no abdominal pain.  Denies sick contacts.  Review of Systems  A complete 10 system review of systems was obtained and all systems are negative except as noted in the HPI and PMH.   Patient's Health History    Past Medical History:  Diagnosis Date  . Asthma     Past Surgical History:  Procedure Laterality Date  . bunion removal    . CESAREAN SECTION      History reviewed. No pertinent family history.  Social History   Socioeconomic History  . Marital status: Married    Spouse name: Not on file  . Number of children: Not on file  . Years of education: Not on file  . Highest education level: Not on file  Occupational History  . Not on file  Social Needs  . Financial resource strain: Not on file  . Food insecurity:    Worry: Not on file    Inability: Not on file  . Transportation needs:    Medical: Not on file    Non-medical: Not on file  Tobacco Use  . Smoking status: Never Smoker  . Smokeless tobacco: Never Used  Substance and Sexual Activity  . Alcohol use: Yes    Alcohol/week: 0.0 standard drinks  . Drug use: No  . Sexual activity: Not on file  Lifestyle  . Physical activity:    Days per week: Not on file    Minutes per session: Not on file  . Stress: Not on file  Relationships  . Social connections:    Talks on phone: Not on file    Gets together: Not on file    Attends religious service: Not on file    Active member of club or organization: Not on file     Attends meetings of clubs or organizations: Not on file    Relationship status: Not on file  . Intimate partner violence:    Fear of current or ex partner: Not on file    Emotionally abused: Not on file    Physically abused: Not on file    Forced sexual activity: Not on file  Other Topics Concern  . Not on file  Social History Narrative  . Not on file     Physical Exam  Vital Signs and Nursing Notes reviewed Vitals:   02/09/18 1831 02/09/18 1852  BP:  (!) 155/71  Pulse:  96  Resp:  20  Temp: (!) 101.7 F (38.7 C) 100.2 F (37.9 C)  SpO2:  95%    CONSTITUTIONAL: Well-appearing, NAD NEURO:  Alert and oriented x 3, no focal deficits EYES:  eyes equal and reactive ENT/NECK:  no LAD, no JVD CARDIO: Regular rate, well-perfused, normal S1 and S2 PULM:  CTAB no wheezing or rhonchi GI/GU:  normal bowel sounds, non-distended, non-tender MSK/SPINE:  No gross deformities, no edema SKIN:  no rash, atraumatic PSYCH:  Appropriate speech and behavior  Diagnostic and Interventional Summary    EKG Interpretation  Date/Time:  Friday February 09 2018 19:17:55 EST Ventricular Rate:  86 PR Interval:  160 QRS Duration: 80 QT Interval:  370 QTC Calculation: 442 R Axis:   -6 Text Interpretation:  Normal sinus rhythm Nonspecific T wave abnormality Abnormal ECG Confirmed by Kennis CarinaBero, Dezyre Hoefer [id number]) on 02/09/2018 7:49:36 PM      Labs Reviewed  GROUP A STREP BY PCR    DG Chest 2 View  Final Result      Medications  ibuprofen (ADVIL,MOTRIN) tablet 800 mg (800 mg Oral Given 02/09/18 1700)  ondansetron (ZOFRAN-ODT) disintegrating tablet 4 mg (4 mg Oral Given 02/09/18 1850)     Procedures Critical Care  ED Course and Medical Decision Making  I have reviewed the triage vital signs and the nursing notes.  Pertinent labs & imaging results that were available during my care of the patient were reviewed by me and considered in my medical decision making (see below for  details).  Consistent with viral illness, likely influenza.  Fever here in the ED but well-appearing with normal vital signs.  Lungs clear.  EKG reassuring, chest x-ray normal, nothing to suggest cardiac etiology of patient's chest tightness.  Favored reactive in the setting of viral process.  Prescription for Tamiflu.  After the discussed management above, the patient was determined to be safe for discharge.  The patient was in agreement with this plan and all questions regarding their care were answered.  ED return precautions were discussed and the patient will return to the ED with any significant worsening of condition.  Elmer SowMichael M. Pilar PlateBero, MD [facility] [email or web address]  Final Clinical Impressions(s) / ED Diagnoses     ICD-10-CM   1. Influenza J11.1   2. Chest pain R07.9 DG Chest 2 View    DG Chest 2 View    ED Discharge Orders         Ordered    oseltamivir (TAMIFLU) 75 MG capsule  Every 12 hours     02/09/18 1950             Sabas SousBero, Jomo Forand M, MD 02/09/18 [phone]

## 2018-02-09 NOTE — Discharge Instructions (Addendum)
You were evaluated in the Emergency Department and after careful evaluation, we did not find any emergent condition requiring admission or further testing in the hospital.  Your symptoms today seem to be due to the flu.  Your swab was negative for strep throat.  Your chest x-ray was normal and your EKG was normal as well.  Please take the medication as directed.  Drink plenty of fluids at home.  Please return to the Emergency Department if you experience any worsening of your condition.  We encourage you to follow up with a primary care provider.  Thank you for allowing [place] to be a part of your care.

## 2018-02-09 NOTE — ED Triage Notes (Signed)
Pt co/ sore throat and fever  X 1 day

## 2018-03-07 DIAGNOSIS — R030 Elevated blood-pressure reading, without diagnosis of hypertension: Secondary | ICD-10-CM | POA: Diagnosis not present

## 2018-03-07 DIAGNOSIS — E559 Vitamin D deficiency, unspecified: Secondary | ICD-10-CM | POA: Diagnosis not present

## 2018-03-07 DIAGNOSIS — R51 Headache: Secondary | ICD-10-CM | POA: Diagnosis not present

## 2018-03-21 DIAGNOSIS — I10 Essential (primary) hypertension: Secondary | ICD-10-CM | POA: Insufficient documentation

## 2018-03-21 DIAGNOSIS — Z1159 Encounter for screening for other viral diseases: Secondary | ICD-10-CM | POA: Diagnosis not present

## 2018-03-21 DIAGNOSIS — Z0000 Encounter for general adult medical examination without abnormal findings: Secondary | ICD-10-CM | POA: Diagnosis not present

## 2018-03-21 DIAGNOSIS — E559 Vitamin D deficiency, unspecified: Secondary | ICD-10-CM | POA: Insufficient documentation

## 2018-03-21 DIAGNOSIS — Z13220 Encounter for screening for lipoid disorders: Secondary | ICD-10-CM | POA: Diagnosis not present

## 2018-03-21 DIAGNOSIS — R7303 Prediabetes: Secondary | ICD-10-CM | POA: Insufficient documentation

## 2018-03-21 DIAGNOSIS — Z1331 Encounter for screening for depression: Secondary | ICD-10-CM | POA: Diagnosis not present

## 2018-03-23 DIAGNOSIS — Z1159 Encounter for screening for other viral diseases: Secondary | ICD-10-CM | POA: Diagnosis not present

## 2018-06-04 DIAGNOSIS — J4541 Moderate persistent asthma with (acute) exacerbation: Secondary | ICD-10-CM | POA: Diagnosis not present

## 2018-06-20 DIAGNOSIS — R0989 Other specified symptoms and signs involving the circulatory and respiratory systems: Secondary | ICD-10-CM | POA: Diagnosis not present

## 2018-06-20 DIAGNOSIS — J4541 Moderate persistent asthma with (acute) exacerbation: Secondary | ICD-10-CM | POA: Diagnosis not present

## 2018-06-20 DIAGNOSIS — J40 Bronchitis, not specified as acute or chronic: Secondary | ICD-10-CM | POA: Diagnosis not present

## 2018-06-27 DIAGNOSIS — J4520 Mild intermittent asthma, uncomplicated: Secondary | ICD-10-CM | POA: Diagnosis not present

## 2018-07-10 DIAGNOSIS — J4520 Mild intermittent asthma, uncomplicated: Secondary | ICD-10-CM | POA: Diagnosis not present

## 2018-08-14 DIAGNOSIS — M940 Chondrocostal junction syndrome [Tietze]: Secondary | ICD-10-CM | POA: Diagnosis not present

## 2018-08-14 DIAGNOSIS — T148XXD Other injury of unspecified body region, subsequent encounter: Secondary | ICD-10-CM | POA: Diagnosis not present

## 2018-09-18 DIAGNOSIS — I10 Essential (primary) hypertension: Secondary | ICD-10-CM | POA: Diagnosis not present

## 2018-10-15 DIAGNOSIS — N898 Other specified noninflammatory disorders of vagina: Secondary | ICD-10-CM | POA: Diagnosis not present

## 2018-10-15 DIAGNOSIS — Z1231 Encounter for screening mammogram for malignant neoplasm of breast: Secondary | ICD-10-CM | POA: Diagnosis not present

## 2018-10-15 DIAGNOSIS — Z01419 Encounter for gynecological examination (general) (routine) without abnormal findings: Secondary | ICD-10-CM | POA: Diagnosis not present

## 2018-10-15 DIAGNOSIS — Z6837 Body mass index (BMI) 37.0-37.9, adult: Secondary | ICD-10-CM | POA: Diagnosis not present

## 2018-11-09 DIAGNOSIS — G4733 Obstructive sleep apnea (adult) (pediatric): Secondary | ICD-10-CM | POA: Diagnosis not present

## 2018-11-09 DIAGNOSIS — Z9989 Dependence on other enabling machines and devices: Secondary | ICD-10-CM | POA: Diagnosis not present

## 2018-12-26 DIAGNOSIS — R140 Abdominal distension (gaseous): Secondary | ICD-10-CM | POA: Diagnosis not present

## 2018-12-26 DIAGNOSIS — R1013 Epigastric pain: Secondary | ICD-10-CM | POA: Diagnosis not present

## 2018-12-27 DIAGNOSIS — H5789 Other specified disorders of eye and adnexa: Secondary | ICD-10-CM | POA: Diagnosis not present

## 2018-12-28 ENCOUNTER — Other Ambulatory Visit (HOSPITAL_BASED_OUTPATIENT_CLINIC_OR_DEPARTMENT_OTHER): Payer: Self-pay | Admitting: Physician Assistant

## 2018-12-28 DIAGNOSIS — R140 Abdominal distension (gaseous): Secondary | ICD-10-CM

## 2018-12-31 ENCOUNTER — Other Ambulatory Visit: Payer: Self-pay

## 2018-12-31 ENCOUNTER — Ambulatory Visit (HOSPITAL_BASED_OUTPATIENT_CLINIC_OR_DEPARTMENT_OTHER)
Admission: RE | Admit: 2018-12-31 | Discharge: 2018-12-31 | Disposition: A | Discharge status: Home / Self Care | Payer: Federal, State, Local not specified - PPO | Source: Ambulatory Visit | Attending: Physician Assistant | Admitting: Physician Assistant

## 2018-12-31 DIAGNOSIS — R140 Abdominal distension (gaseous): Secondary | ICD-10-CM

## 2019-01-08 ENCOUNTER — Ambulatory Visit (HOSPITAL_BASED_OUTPATIENT_CLINIC_OR_DEPARTMENT_OTHER)
Admission: RE | Admit: 2019-01-08 | Discharge: 2019-01-08 | Disposition: A | Discharge status: Home / Self Care | Payer: Federal, State, Local not specified - PPO | Source: Ambulatory Visit | Attending: Physician Assistant | Admitting: Physician Assistant

## 2019-01-08 ENCOUNTER — Other Ambulatory Visit: Payer: Self-pay

## 2019-01-08 DIAGNOSIS — K8020 Calculus of gallbladder without cholecystitis without obstruction: Secondary | ICD-10-CM | POA: Diagnosis not present

## 2019-01-08 DIAGNOSIS — R140 Abdominal distension (gaseous): Secondary | ICD-10-CM | POA: Insufficient documentation

## 2019-01-14 DIAGNOSIS — G4733 Obstructive sleep apnea (adult) (pediatric): Secondary | ICD-10-CM | POA: Diagnosis not present

## 2019-02-18 DIAGNOSIS — G4733 Obstructive sleep apnea (adult) (pediatric): Secondary | ICD-10-CM | POA: Diagnosis not present

## 2019-02-18 DIAGNOSIS — Z9989 Dependence on other enabling machines and devices: Secondary | ICD-10-CM | POA: Diagnosis not present

## 2019-02-25 ENCOUNTER — Ambulatory Visit: Payer: Self-pay | Admitting: Surgery

## 2019-02-25 DIAGNOSIS — J309 Allergic rhinitis, unspecified: Secondary | ICD-10-CM | POA: Diagnosis not present

## 2019-02-25 DIAGNOSIS — J4520 Mild intermittent asthma, uncomplicated: Secondary | ICD-10-CM | POA: Diagnosis not present

## 2019-02-25 DIAGNOSIS — K8020 Calculus of gallbladder without cholecystitis without obstruction: Secondary | ICD-10-CM | POA: Diagnosis not present

## 2019-02-25 NOTE — H&P (Signed)
rnell CLEA DUBACH Documented: 02/25/2019 1:48 PM Location: [facility] Patient #: [id number] DOB: [birth year] Married / Language: English / Race: Black or African American Female  History of Present Illness Mccallum A. Jenilyn Magana MD; 02/25/2019 2:49 PM) Patient words: Patient sent at the request of Dr. Abigail Miyamoto for abdominal bloating, mild right upper quadrant pain after meals and gallstones detected on ultrasound. The patient is a multi-month history of progressive bloating after meals. Fatty greasy meals makes her bloating worse and she has mild right upper quadrant pain after eating these foods. She was evaluated for feeding intolerance to lactose was negative. Ultrasound showed gallstones in her gallbladder without common bile duct dilation nor signs of inflammation. Her pain is made worse with fatty greasy foods. She tries to avoid these to keep her symptoms at bay. Pain is described as a burning sensation and soreness in her right upper quadrant without radiation moderate intensity      Abdominal bloating  EXAM: ABDOMEN ULTRASOUND COMPLETE  COMPARISON: None.  FINDINGS: Gallbladder: Within the gallbladder, there are echogenic foci which move and shadow consistent with cholelithiasis. Largest individual gallstone measures 2.3 cm in length. Gallbladder wall thickness is upper normal without overt gallbladder wall edema. No pericholecystic fluid. No sonographic Murphy sign noted by sonographer.  Common bile duct: Diameter: 3 mm. No intrahepatic, common hepatic, or common bile duct dilatation.  Liver: No focal lesion identified. Within normal limits in parenchymal echogenicity. Portal vein is patent on color Doppler imaging with normal direction of blood flow towards the liver.  IVC: No abnormality visualized.  Pancreas: No pancreatic mass or inflammatory focus.  Spleen: Size and appearance within normal limits.  Right Kidney: Length: 10.3 cm. Echogenicity within  normal limits. No mass or hydronephrosis visualized.  Left Kidney: Length: 10.7 cm. Echogenicity within normal limits. No mass or hydronephrosis visualized.  Abdominal aorta: No aneurysm visualized.  Other findings: No demonstrable ascites.  IMPRESSION: 1. Cholelithiasis with gallbladder largely filled with calculi. Gallbladder wall thickness upper normal. It may be prudent to correlate with nuclear medicine hepatobiliary imaging study to assess for cystic duct patency.  2. Study otherwise unremarkable.   Electronically Signed By: Bretta Bang III M.D. On: 01/08/2019 13:51.  The patient is a 59 year old female.   Past Surgical History Santiago Glad, CMA; 02/25/2019 1:56 PM) Foot Surgery Right.  Diagnostic Studies History Santiago Glad, [place]; 02/25/2019 1:56 PM) Colonoscopy never Mammogram within last year Pap Smear 1-5 years ago  Allergies Santiago Glad, CMA; 02/25/2019 1:49 PM) Soybean SHELLFISH Allergies Reconciled  Medication History Santiago Glad, CMA; 02/25/2019 1:50 PM) Azelastine HCl (0.1% Solution, Nasal) Active. amLODIPine Besylate (5MG  Tablet, Oral) Active. Montelukast Sodium (10MG  Tablet, Oral) Active. Iron (65MG  Tablet, Oral) Active. Omeprazole (20MG  Capsule DR, Oral) Active. Vitamin D (Ergocalciferol) (1.25 MG(50000 UT) Capsule, Oral) Active. Medications Reconciled  Social History , ; 02/25/2019 1:56 PM) Alcohol use Occasional alcohol use. Caffeine use Coffee, Tea. No drug use Tobacco use Never smoker.  Family History , Santiago Glad; 02/25/2019 1:56 PM) Diabetes Mellitus Brother. Hypertension Mother.  Pregnancy / Birth History 04/25/2019, Santiago Glad; 02/25/2019 1:56 PM) Age at menarche 11 years. Age of menopause 98-55 Gravida 4 Irregular periods Length (months) of breastfeeding 3-6 Maternal age 67-25 Para 2  Other Problems [place], CMA; 02/25/2019 1:56 PM) Asthma High  blood pressure Sleep Apnea     Review of Systems 46-48 CMA; 02/25/2019 1:56 PM) General Present- Weight Gain. Not Present- Appetite Loss, Chills, Fatigue, Fever, Night Sweats and Weight Loss.  Skin Not Present- Change in Wart/Mole, Dryness, Hives, Jaundice, New Lesions, Non-Healing Wounds, Rash and Ulcer. HEENT Present- Seasonal Allergies. Not Present- Earache, Hearing Loss, Hoarseness, Nose Bleed, Oral Ulcers, Ringing in the Ears, Sinus Pain, Sore Throat, Visual Disturbances, Wears glasses/contact lenses and Yellow Eyes. Respiratory Present- Wheezing. Not Present- Bloody sputum, Chronic Cough, Difficulty Breathing and Snoring. Breast Not Present- Breast Mass, Breast Pain, Nipple Discharge and Skin Changes. Cardiovascular Not Present- Chest Pain, Difficulty Breathing Lying Down, Leg Cramps, Palpitations, Rapid Heart Rate, Shortness of Breath and Swelling of Extremities. Gastrointestinal Present- Bloating. Not Present- Abdominal Pain, Bloody Stool, Change in Bowel Habits, Chronic diarrhea, Constipation, Difficulty Swallowing, Excessive gas, Gets full quickly at meals, Hemorrhoids, Indigestion, Nausea, Rectal Pain and Vomiting. Female Genitourinary Not Present- Frequency, Nocturia, Painful Urination, Pelvic Pain and Urgency. Musculoskeletal Not Present- Back Pain, Joint Pain, Joint Stiffness, Muscle Pain, Muscle Weakness and Swelling of Extremities. Neurological Present- Headaches. Not Present- Decreased Memory, Fainting, Numbness, Seizures, Tingling, Tremor, Trouble walking and Weakness. Psychiatric Not Present- Anxiety, Bipolar, Change in Sleep Pattern, Depression, Fearful and Frequent crying. Endocrine Present- Hot flashes. Not Present- Cold Intolerance, Excessive Hunger, Hair Changes, Heat Intolerance and New Diabetes. Hematology Not Present- Blood Thinners, Easy Bruising, Excessive bleeding, Gland problems, HIV and Persistent Infections.  Vitals Emeline Gins CMA; 02/25/2019 1:49  PM) 02/25/2019 1:48 PM Weight: 226.4 lb Height: 66in Body Surface Area: 2.11 m Body Mass Index: 36.54 kg/m  Temp.: 97.84F  Pulse: 64 (Regular)  BP: 144/80 (Sitting, Left Arm, Standard)        Physical Exam (Jump A. Monic Engelmann MD; 02/25/2019 2:55 PM)  General Mental Status-Alert. General Appearance-Consistent with stated age. Hydration-Well hydrated. Voice-Normal.  Chest and Lung Exam Inspection Chest Wall - Inspection of the chest wall reveals - No Fluctuant masses. Shape - Normal. Movements - Normal. Accessory muscles - No use of accessory muscles in breathing.  Cardiovascular Cardiovascular examination reveals -on palpation PMI is normal in location and amplitude, no palpable S3 or S4. Normal cardiac borders., (see Vital Signs section for blood pressure measurements) and normal pedal pulses bilaterally.  Abdomen Palpation/Percussion Palpation and Percussion of the abdomen reveal - Soft, Non Tender, No Rebound tenderness and No Rigidity (guarding).  Neurologic Neurologic evaluation reveals -alert and oriented x 3 with no impairment of recent or remote memory. Mental Status-Normal.  Neuropsychiatric The patient's mood and affect are described as -anxious.  Musculoskeletal Normal Exam - Left-Upper Extremity Strength Normal and Lower Extremity Strength Normal. Normal Exam - Right-Upper Extremity Strength Normal, Lower Extremity Weakness.    Assessment & Plan (Sandridge A. Kasiya Burck MD; 02/25/2019 2:55 PM)  SYMPTOMATIC CHOLELITHIASIS (K80.20) Impression: The procedure has been discussed with the patient. Risks of laparoscopic cholecystectomy include bleeding, infection, bile duct injury, leak, death, open surgery, diarrhea, other surgery, organ injury, blood vessel injury, DVT, and additional care.  total time 40 minutes for face to face time consulting chart record labs etc discuss surgery and non surgical options risks/ benefits and long term  expectations  Current Plans You are being scheduled for surgery- Our schedulers will call you.  You should hear from our office's scheduling department within 5 working days about the location, date, and time of surgery. We try to make accommodations for patient's preferences in scheduling surgery, but sometimes the OR schedule or the surgeon's schedule prevents [place] from making those accommodations.  If you have not heard from our office [phone]) in 5 working days, call the office and ask for your surgeon's nurse.  If you have other  questions about your diagnosis, plan, or surgery, call the office and ask for your surgeon's nurse.  Pt Education - Pamphlet Given - Laparoscopic Gallbladder Surgery: discussed with patient and provided information. Pt Education - CCS Laparosopic Post Op HCI (Gross) Pt Education - Laparoscopic Cholecystectomy: gallbladder The anatomy & physiology of hepatobiliary & pancreatic function was discussed. The pathophysiology of gallbladder dysfunction was discussed. Natural history risks without surgery was discussed. I feel the risks of no intervention will lead to serious problems that outweigh the operative risks; therefore, I recommended cholecystectomy to remove the pathology. I explained laparoscopic techniques with possible need for an open approach. Probable cholangiogram to evaluate the bilary tract was explained as well.  Risks such as bleeding, infection, abscess, leak, injury to other organs, need for further treatment, heart attack, death, and other risks were discussed. I noted a good likelihood this will help address the problem. Possibility that this will not correct all abdominal symptoms was explained. Goals of post-operative recovery were discussed as well. We will work to minimize complications. An educational handout further explaining the pathology and treatment options was given as well. Questions were answered. The patient expresses  understanding & wishes to proceed with surgery.

## 2019-03-19 DIAGNOSIS — H9313 Tinnitus, bilateral: Secondary | ICD-10-CM | POA: Diagnosis not present

## 2019-04-10 DIAGNOSIS — H903 Sensorineural hearing loss, bilateral: Secondary | ICD-10-CM | POA: Diagnosis not present

## 2019-04-10 DIAGNOSIS — H9313 Tinnitus, bilateral: Secondary | ICD-10-CM | POA: Diagnosis not present

## 2019-04-25 ENCOUNTER — Other Ambulatory Visit: Payer: Self-pay

## 2019-04-25 ENCOUNTER — Encounter (HOSPITAL_BASED_OUTPATIENT_CLINIC_OR_DEPARTMENT_OTHER): Payer: Self-pay | Admitting: Surgery

## 2019-04-29 ENCOUNTER — Other Ambulatory Visit (HOSPITAL_COMMUNITY)
Admission: RE | Admit: 2019-04-29 | Discharge: 2019-04-29 | Disposition: A | Discharge status: Home / Self Care | Payer: Federal, State, Local not specified - PPO | Source: Ambulatory Visit | Attending: Surgery | Admitting: Surgery

## 2019-04-29 ENCOUNTER — Other Ambulatory Visit: Payer: Self-pay

## 2019-04-29 ENCOUNTER — Encounter (HOSPITAL_BASED_OUTPATIENT_CLINIC_OR_DEPARTMENT_OTHER)
Admission: RE | Admit: 2019-04-29 | Discharge: 2019-04-29 | Disposition: A | Discharge status: Home / Self Care | Payer: Federal, State, Local not specified - PPO | Source: Ambulatory Visit | Attending: Surgery | Admitting: Surgery

## 2019-04-29 DIAGNOSIS — K8010 Calculus of gallbladder with chronic cholecystitis without obstruction: Secondary | ICD-10-CM | POA: Diagnosis not present

## 2019-04-29 DIAGNOSIS — J45909 Unspecified asthma, uncomplicated: Secondary | ICD-10-CM | POA: Diagnosis not present

## 2019-04-29 DIAGNOSIS — Z20822 Contact with and (suspected) exposure to covid-19: Secondary | ICD-10-CM | POA: Diagnosis not present

## 2019-04-29 DIAGNOSIS — Z79899 Other long term (current) drug therapy: Secondary | ICD-10-CM | POA: Diagnosis not present

## 2019-04-29 DIAGNOSIS — R030 Elevated blood-pressure reading, without diagnosis of hypertension: Secondary | ICD-10-CM | POA: Diagnosis not present

## 2019-04-29 DIAGNOSIS — Z01812 Encounter for preprocedural laboratory examination: Secondary | ICD-10-CM | POA: Insufficient documentation

## 2019-04-29 DIAGNOSIS — G4730 Sleep apnea, unspecified: Secondary | ICD-10-CM | POA: Diagnosis not present

## 2019-04-29 DIAGNOSIS — Z8249 Family history of ischemic heart disease and other diseases of the circulatory system: Secondary | ICD-10-CM | POA: Diagnosis not present

## 2019-04-29 LAB — COMPREHENSIVE METABOLIC PANEL
ALT: 17 U/L (ref 0–44)
AST: 17 U/L (ref 15–41)
Albumin: 3.7 g/dL (ref 3.5–5.0)
Alkaline Phosphatase: 80 U/L (ref 38–126)
Anion gap: 7 (ref 5–15)
BUN: 12 mg/dL (ref 6–20)
CO2: 29 mmol/L (ref 22–32)
Calcium: 9 mg/dL (ref 8.9–10.3)
Chloride: 103 mmol/L (ref 98–111)
Creatinine, Ser: 0.78 mg/dL (ref 0.44–1.00)
GFR calc Af Amer: >60 mL/min (ref >60)
GFR calc non Af Amer: >60 mL/min (ref >60)
Glucose, Bld: 93 mg/dL (ref 70–99)
Potassium: 4.2 mmol/L (ref 3.5–5.1)
Sodium: 139 mmol/L (ref 135–145)
Total Bilirubin: 0.4 mg/dL (ref 0.3–1.2)
Total Protein: 7.4 g/dL (ref 6.5–8.1)

## 2019-04-29 LAB — CBC WITH DIFFERENTIAL/PLATELET
Abs Immature Granulocytes: 0.02 10*3/uL (ref 0.00–0.07)
Basophils Absolute: 0 10*3/uL (ref 0.0–0.1)
Basophils Relative: 1 %
Eosinophils Absolute: 0 10*3/uL (ref 0.0–0.5)
Eosinophils Relative: 0 %
HCT: 38.7 % (ref 36.0–46.0)
Hemoglobin: 12.2 g/dL (ref 12.0–15.0)
Immature Granulocytes: 0 %
Lymphocytes Relative: 32 %
Lymphs Abs: 2.1 10*3/uL (ref 0.7–4.0)
MCH: 27.7 pg (ref 26.0–34.0)
MCHC: 31.5 g/dL (ref 30.0–36.0)
MCV: 87.8 fL (ref 80.0–100.0)
Monocytes Absolute: 0.5 10*3/uL (ref 0.1–1.0)
Monocytes Relative: 8 %
Neutro Abs: 3.9 10*3/uL (ref 1.7–7.7)
Neutrophils Relative %: 59 %
Platelets: 393 10*3/uL (ref 150–400)
RBC: 4.41 MIL/uL (ref 3.87–5.11)
RDW: 15.4 % (ref 11.5–15.5)
WBC: 6.6 10*3/uL (ref 4.0–10.5)
nRBC: 0 % (ref 0.0–0.2)

## 2019-04-29 LAB — SARS CORONAVIRUS 2 (TAT 6-24 HRS): SARS Coronavirus 2: NEGATIVE

## 2019-04-29 NOTE — Progress Notes (Signed)

## 2019-05-02 ENCOUNTER — Encounter (HOSPITAL_BASED_OUTPATIENT_CLINIC_OR_DEPARTMENT_OTHER)
Admission: RE | Disposition: A | Discharge status: Home / Self Care | Payer: Self-pay | Source: Home / Self Care | Attending: Surgery

## 2019-05-02 ENCOUNTER — Other Ambulatory Visit: Payer: Self-pay

## 2019-05-02 ENCOUNTER — Ambulatory Visit (HOSPITAL_BASED_OUTPATIENT_CLINIC_OR_DEPARTMENT_OTHER): Payer: Federal, State, Local not specified - PPO | Admitting: Anesthesiology

## 2019-05-02 ENCOUNTER — Encounter (HOSPITAL_BASED_OUTPATIENT_CLINIC_OR_DEPARTMENT_OTHER): Payer: Self-pay | Admitting: Surgery

## 2019-05-02 ENCOUNTER — Ambulatory Visit (HOSPITAL_BASED_OUTPATIENT_CLINIC_OR_DEPARTMENT_OTHER)
Admission: RE | Admit: 2019-05-02 | Discharge: 2019-05-02 | Disposition: A | Discharge status: Home / Self Care | Payer: Federal, State, Local not specified - PPO | Attending: Surgery | Admitting: Surgery

## 2019-05-02 DIAGNOSIS — K8020 Calculus of gallbladder without cholecystitis without obstruction: Secondary | ICD-10-CM | POA: Diagnosis not present

## 2019-05-02 DIAGNOSIS — R030 Elevated blood-pressure reading, without diagnosis of hypertension: Secondary | ICD-10-CM | POA: Diagnosis not present

## 2019-05-02 DIAGNOSIS — Z79899 Other long term (current) drug therapy: Secondary | ICD-10-CM | POA: Insufficient documentation

## 2019-05-02 DIAGNOSIS — G4730 Sleep apnea, unspecified: Secondary | ICD-10-CM | POA: Insufficient documentation

## 2019-05-02 DIAGNOSIS — K8010 Calculus of gallbladder with chronic cholecystitis without obstruction: Secondary | ICD-10-CM | POA: Diagnosis not present

## 2019-05-02 DIAGNOSIS — J45909 Unspecified asthma, uncomplicated: Secondary | ICD-10-CM | POA: Diagnosis not present

## 2019-05-02 DIAGNOSIS — Z8249 Family history of ischemic heart disease and other diseases of the circulatory system: Secondary | ICD-10-CM | POA: Insufficient documentation

## 2019-05-02 DIAGNOSIS — I10 Essential (primary) hypertension: Secondary | ICD-10-CM | POA: Diagnosis not present

## 2019-05-02 HISTORY — DX: Essential (primary) hypertension: I10

## 2019-05-02 HISTORY — PX: CHOLECYSTECTOMY: SHX55

## 2019-05-02 HISTORY — DX: Other seasonal allergic rhinitis: J30.2

## 2019-05-02 HISTORY — DX: Unspecified osteoarthritis, unspecified site: M19.90

## 2019-05-02 HISTORY — DX: Sleep apnea, unspecified: G47.30

## 2019-05-02 SURGERY — LAPAROSCOPIC CHOLECYSTECTOMY
Anesthesia: General | Site: Abdomen

## 2019-05-02 MED ORDER — MIDAZOLAM HCL 5 MG/5ML IJ SOLN
INTRAMUSCULAR | Status: DC | PRN
  Administered 2019-05-02: 2 mg via INTRAVENOUS

## 2019-05-02 MED ORDER — MIDAZOLAM HCL 2 MG/2ML IJ SOLN: 1.0000 mg | INTRAMUSCULAR | Status: DC | PRN

## 2019-05-02 MED ORDER — ONDANSETRON HCL 4 MG/2ML IJ SOLN
INTRAMUSCULAR | Status: AC
  Filled 2019-05-02: qty 2

## 2019-05-02 MED ORDER — OXYCODONE HCL 5 MG/5ML PO SOLN: 5.0000 mg | Freq: Once | ORAL | Status: AC | PRN

## 2019-05-02 MED ORDER — CHLORHEXIDINE GLUCONATE CLOTH 2 % EX PADS: 6.0000 | MEDICATED_PAD | Freq: Once | CUTANEOUS | Status: DC

## 2019-05-02 MED ORDER — GABAPENTIN 300 MG PO CAPS
ORAL_CAPSULE | ORAL | Status: AC
  Filled 2019-05-02: qty 1

## 2019-05-02 MED ORDER — OXYCODONE HCL 5 MG PO TABS: 5.0000 mg | ORAL_TABLET | Freq: Four times a day (QID) | ORAL | 0 refills | Status: DC | PRN

## 2019-05-02 MED ORDER — LIDOCAINE 2% (20 MG/ML) 5 ML SYRINGE
INTRAMUSCULAR | Status: AC
  Filled 2019-05-02: qty 5

## 2019-05-02 MED ORDER — GABAPENTIN 300 MG PO CAPS
300.0000 mg | ORAL_CAPSULE | ORAL | Status: AC
  Administered 2019-05-02: 300 mg via ORAL

## 2019-05-02 MED ORDER — FENTANYL CITRATE (PF) 100 MCG/2ML IJ SOLN: 50.0000 ug | INTRAMUSCULAR | Status: DC | PRN

## 2019-05-02 MED ORDER — ACETAMINOPHEN 500 MG PO TABS
ORAL_TABLET | ORAL | Status: AC
  Filled 2019-05-02: qty 2

## 2019-05-02 MED ORDER — IBUPROFEN 800 MG PO TABS: 800.0000 mg | ORAL_TABLET | Freq: Three times a day (TID) | ORAL | 0 refills | Status: DC | PRN

## 2019-05-02 MED ORDER — FENTANYL CITRATE (PF) 100 MCG/2ML IJ SOLN
INTRAMUSCULAR | Status: AC
  Filled 2019-05-02: qty 2

## 2019-05-02 MED ORDER — LIDOCAINE 2% (20 MG/ML) 5 ML SYRINGE
INTRAMUSCULAR | Status: DC | PRN
  Administered 2019-05-02: 60 mg via INTRAVENOUS

## 2019-05-02 MED ORDER — LACTATED RINGERS IV SOLN: INTRAVENOUS | Status: DC

## 2019-05-02 MED ORDER — ONDANSETRON HCL 4 MG/2ML IJ SOLN
INTRAMUSCULAR | Status: DC | PRN
  Administered 2019-05-02: 4 mg via INTRAVENOUS

## 2019-05-02 MED ORDER — DEXAMETHASONE SODIUM PHOSPHATE 10 MG/ML IJ SOLN
INTRAMUSCULAR | Status: AC
  Filled 2019-05-02: qty 1

## 2019-05-02 MED ORDER — BUPIVACAINE HCL (PF) 0.25 % IJ SOLN
INTRAMUSCULAR | Status: DC | PRN
  Administered 2019-05-02: 8 mL

## 2019-05-02 MED ORDER — DEXTROSE 5 % IV SOLN
3.0000 g | INTRAVENOUS | Status: AC
  Administered 2019-05-02: 11:00:00 2 g via INTRAVENOUS

## 2019-05-02 MED ORDER — FENTANYL CITRATE (PF) 100 MCG/2ML IJ SOLN
25.0000 ug | INTRAMUSCULAR | Status: DC | PRN
  Administered 2019-05-02 (×2): 25 ug via INTRAVENOUS

## 2019-05-02 MED ORDER — ROCURONIUM BROMIDE 10 MG/ML (PF) SYRINGE
PREFILLED_SYRINGE | INTRAVENOUS | Status: DC | PRN
  Administered 2019-05-02: 40 mg via INTRAVENOUS

## 2019-05-02 MED ORDER — DEXAMETHASONE SODIUM PHOSPHATE 4 MG/ML IJ SOLN
INTRAMUSCULAR | Status: DC | PRN
  Administered 2019-05-02: 6 mg via INTRAVENOUS

## 2019-05-02 MED ORDER — ROCURONIUM BROMIDE 10 MG/ML (PF) SYRINGE
PREFILLED_SYRINGE | INTRAVENOUS | Status: AC
  Filled 2019-05-02: qty 10

## 2019-05-02 MED ORDER — CEFAZOLIN SODIUM-DEXTROSE 2-4 GM/100ML-% IV SOLN
INTRAVENOUS | Status: AC
  Filled 2019-05-02: qty 100

## 2019-05-02 MED ORDER — SODIUM CHLORIDE 0.9 % IR SOLN
Status: DC | PRN
  Administered 2019-05-02: 1

## 2019-05-02 MED ORDER — OXYCODONE HCL 5 MG PO TABS
ORAL_TABLET | ORAL | Status: AC
  Filled 2019-05-02: qty 1

## 2019-05-02 MED ORDER — ACETAMINOPHEN 500 MG PO TABS
1000.0000 mg | ORAL_TABLET | ORAL | Status: AC
  Administered 2019-05-02: 1000 mg via ORAL

## 2019-05-02 MED ORDER — FENTANYL CITRATE (PF) 100 MCG/2ML IJ SOLN
INTRAMUSCULAR | Status: DC | PRN
  Administered 2019-05-02 (×2): 50 ug via INTRAVENOUS

## 2019-05-02 MED ORDER — ONDANSETRON HCL 4 MG/2ML IJ SOLN: 4.0000 mg | Freq: Once | INTRAMUSCULAR | Status: DC | PRN

## 2019-05-02 MED ORDER — OXYCODONE HCL 5 MG PO TABS
5.0000 mg | ORAL_TABLET | Freq: Once | ORAL | Status: AC | PRN
  Administered 2019-05-02: 5 mg via ORAL

## 2019-05-02 MED ORDER — MIDAZOLAM HCL 2 MG/2ML IJ SOLN
INTRAMUSCULAR | Status: AC
  Filled 2019-05-02: qty 2

## 2019-05-02 MED ORDER — PROPOFOL 10 MG/ML IV BOLUS
INTRAVENOUS | Status: DC | PRN
  Administered 2019-05-02: 150 mg via INTRAVENOUS

## 2019-05-02 MED ORDER — SUGAMMADEX SODIUM 200 MG/2ML IV SOLN
INTRAVENOUS | Status: DC | PRN
  Administered 2019-05-02: 200 mg via INTRAVENOUS

## 2019-05-02 SURGICAL SUPPLY — 44 items
APPLIER CLIP ROT 10 11.4 M/L (STAPLE) ×4
CANISTER SUCT 1200ML W/VALVE (MISCELLANEOUS) ×4 IMPLANT
CHLORAPREP W/TINT 26 (MISCELLANEOUS) ×4 IMPLANT
CLIP APPLIE ROT 10 11.4 M/L (STAPLE) ×2 IMPLANT
CLOSURE WOUND 1/2 X4 (GAUZE/BANDAGES/DRESSINGS)
COVER MAYO STAND STRL (DRAPES) ×4 IMPLANT
COVER WAND RF STERILE (DRAPES) IMPLANT
DERMABOND ADVANCED (GAUZE/BANDAGES/DRESSINGS) ×2
DERMABOND ADVANCED .7 DNX12 (GAUZE/BANDAGES/DRESSINGS) ×2 IMPLANT
DRAPE C-ARM 42X72 X-RAY (DRAPES) ×4 IMPLANT
DRAPE LAPAROSCOPIC ABDOMINAL (DRAPES) ×4 IMPLANT
ELECT REM PT RETURN 9FT ADLT (ELECTROSURGICAL) ×4
ELECTRODE REM PT RTRN 9FT ADLT (ELECTROSURGICAL) ×2 IMPLANT
GLOVE BIO SURGEON STRL SZ 6.5 (GLOVE) ×3 IMPLANT
GLOVE BIO SURGEON STRL SZ8 (GLOVE) ×4 IMPLANT
GLOVE BIO SURGEONS STRL SZ 6.5 (GLOVE) ×1
GLOVE BIOGEL PI IND STRL 6.5 (GLOVE) ×2 IMPLANT
GLOVE BIOGEL PI IND STRL 8 (GLOVE) ×2 IMPLANT
GLOVE BIOGEL PI INDICATOR 6.5 (GLOVE) ×2
GLOVE BIOGEL PI INDICATOR 8 (GLOVE) ×2
GLOVE ECLIPSE 6.5 STRL STRAW (GLOVE) ×4 IMPLANT
GOWN STRL REUS W/ TWL LRG LVL3 (GOWN DISPOSABLE) ×6 IMPLANT
GOWN STRL REUS W/TWL LRG LVL3 (GOWN DISPOSABLE) ×6
HEMOSTAT SNOW SURGICEL 2X4 (HEMOSTASIS) IMPLANT
LINER CANISTER 1000CC FLEX (MISCELLANEOUS) ×4 IMPLANT
NS IRRIG 1000ML POUR BTL (IV SOLUTION) ×4 IMPLANT
PACK BASIN DAY SURGERY FS (CUSTOM PROCEDURE TRAY) ×4 IMPLANT
POUCH SPECIMEN RETRIEVAL 10MM (ENDOMECHANICALS) ×4 IMPLANT
SCISSORS LAP 5X35 DISP (ENDOMECHANICALS) ×4 IMPLANT
SET CHOLANGIOGRAPH 5 50 .035 (SET/KITS/TRAYS/PACK) IMPLANT
SET IRRIG TUBING LAPAROSCOPIC (IRRIGATION / IRRIGATOR) ×4 IMPLANT
SET TUBE SMOKE EVAC HIGH FLOW (TUBING) ×4 IMPLANT
SLEEVE ENDOPATH XCEL 5M (ENDOMECHANICALS) ×4 IMPLANT
SLEEVE SCD COMPRESS KNEE MED (MISCELLANEOUS) ×4 IMPLANT
STRIP CLOSURE SKIN 1/2X4 (GAUZE/BANDAGES/DRESSINGS) IMPLANT
SUT MNCRL AB 4-0 PS2 18 (SUTURE) ×4 IMPLANT
SUT VICRYL 0 UR6 27IN ABS (SUTURE) ×4 IMPLANT
TOWEL GREEN STERILE FF (TOWEL DISPOSABLE) ×4 IMPLANT
TRAY LAPAROSCOPIC (CUSTOM PROCEDURE TRAY) ×4 IMPLANT
TROCAR XCEL BLUNT TIP 100MML (ENDOMECHANICALS) ×4 IMPLANT
TROCAR XCEL NON-BLD 11X100MML (ENDOMECHANICALS) ×4 IMPLANT
TROCAR XCEL NON-BLD 5MMX100MML (ENDOMECHANICALS) ×4 IMPLANT
TUBE CONNECTING 20'X1/4 (TUBING) ×1
TUBE CONNECTING 20X1/4 (TUBING) ×3 IMPLANT

## 2019-05-02 NOTE — Interval H&P Note (Signed)
History and Physical Interval Note:  05/02/2019 9:16 AM  Breanna Jones  has presented today for surgery, with the diagnosis of GALLSTONES.  The various methods of treatment have been discussed with the patient and family. After consideration of risks, benefits and other options for treatment, the patient has consented to  Procedure(s): LAPAROSCOPIC CHOLECYSTECTOMY WITH INTRAOPERATIVE CHOLANGIOGRAM (N/A) as a surgical intervention.  The patient's history has been reviewed, patient examined, no change in status, stable for surgery.  I have reviewed the patient's chart and labs.  Questions were answered to the patient's satisfaction.     [place] A Ravan Schlemmer

## 2019-05-02 NOTE — Anesthesia Procedure Notes (Signed)
Procedure Name: Intubation Date/Time: 05/02/2019 10:38 AM Performed by: Caren Macadam, CRNA Pre-anesthesia Checklist: Patient identified, Emergency Drugs available, Suction available and Patient being monitored Patient Re-evaluated:Patient Re-evaluated prior to induction Oxygen Delivery Method: Circle system utilized Preoxygenation: Pre-oxygenation with 100% oxygen Induction Type: IV induction Ventilation: Mask ventilation without difficulty Laryngoscope Size: Miller and 2 Grade View: Grade I Tube type: Oral Number of attempts: 1 Airway Equipment and Method: Stylet Placement Confirmation: ETT inserted through vocal cords under direct vision,  positive ETCO2 and breath sounds checked- equal and bilateral Secured at: 21 cm Tube secured with: Tape Dental Injury: Teeth and Oropharynx as per pre-operative assessment

## 2019-05-02 NOTE — Transfer of Care (Signed)
Immediate Anesthesia Transfer of Care Note  Patient: Breanna Jones  Procedure(s) Performed: LAPAROSCOPIC CHOLECYSTECTOMY (N/A Abdomen)  Patient Location: PACU  Anesthesia Type:General  Level of Consciousness: drowsy  Airway & Oxygen Therapy: Patient Spontanous Breathing and Patient connected to face mask oxygen  Post-op Assessment: Report given to RN and Post -op Vital signs reviewed and stable  Post vital signs: Reviewed and stable  Last Vitals:  Vitals Value Taken Time  BP 136/66 05/02/19 1149  Temp    Pulse 69 05/02/19 1150  Resp 21 05/02/19 1150  SpO2 100 % 05/02/19 1150  Vitals shown include unvalidated device data.  Last Pain:  Vitals:   05/02/19 0909  PainSc: 4       Patients Stated Pain Goal: 6 (05/02/19 0909)  Complications: No apparent anesthesia complications

## 2019-05-02 NOTE — H&P (Signed)
Breanna Jones    Location: [facility]  Patient #: [id number]  DOB: 09/07/1960  Married / Language: English / Race: Black or African American  Female  History of Present Illness Patient words: Patient sent at the request of Dr. Abigail Miyamoto for abdominal bloating, mild right upper quadrant pain after meals and gallstones detected on ultrasound. The patient is a multi-month history of progressive bloating after meals. Fatty greasy meals makes her bloating worse and she has mild right upper quadrant pain after eating these foods. She was evaluated for feeding intolerance to lactose was negative. Ultrasound showed gallstones in her gallbladder without common bile duct dilation nor signs of inflammation. Her pain is made worse with fatty greasy foods. She tries to avoid these to keep her symptoms at bay. Pain is described as a burning sensation and soreness in her right upper quadrant without radiation moderate intensity  Abdominal bloating  EXAM:  ABDOMEN ULTRASOUND COMPLETE  COMPARISON: None.  FINDINGS:  Gallbladder: Within the gallbladder, there are echogenic foci which  move and shadow consistent with cholelithiasis. Largest individual  gallstone measures 2.3 cm in length. Gallbladder wall thickness is  upper normal without overt gallbladder wall edema. No  pericholecystic fluid. No sonographic Murphy sign noted by  sonographer.  Common bile duct: Diameter: 3 mm. No intrahepatic, common hepatic,  or common bile duct dilatation.  Liver: No focal lesion identified. Within normal limits in  parenchymal echogenicity. Portal vein is patent on color Doppler  imaging with normal direction of blood flow towards the liver.  IVC: No abnormality visualized.  Pancreas: No pancreatic mass or inflammatory focus.  Spleen: Size and appearance within normal limits.  Right Kidney: Length: 10.3 cm. Echogenicity within normal limits. No  mass or hydronephrosis visualized.  Left Kidney: Length: 10.7  cm. Echogenicity within normal limits. No  mass or hydronephrosis visualized.  Abdominal aorta: No aneurysm visualized.  Other findings: No demonstrable ascites.  IMPRESSION:  1. Cholelithiasis with gallbladder largely filled with calculi.  Gallbladder wall thickness upper normal. It may be prudent to  correlate with nuclear medicine hepatobiliary imaging study to  assess for cystic duct patency.  2. Study otherwise unremarkable.  Electronically Signed  By: Bretta Bang III M.D.  On: 01/08/2019 13:51.  The patient is a 59 year old female.  Past Surgical History (  Foot Surgery Right.  Diagnostic Studies History   Colonoscopy never  Mammogram within last year  Pap Smear 1-5 years ago  Allergies Soybean  SHELLFISH  Allergies Reconciled  Medication History  Azelastine HCl (0.1% Solution, Nasal) Active.  amLODIPine Besylate (5MG  Tablet, Oral) Active.  Montelukast Sodium (10MG  Tablet, Oral) Active.  Iron (65MG  Tablet, Oral) Active.  Omeprazole (20MG  Capsule DR, Oral) Active.  Vitamin D (Ergocalciferol) (1.25 MG(50000 UT) Capsule, Oral) Active.  Medications Reconciled  Social History  Alcohol use Occasional alcohol use.  Caffeine use Coffee, Tea.  No drug use  Tobacco use Never smoker.  Family History Diabetes Mellitus Brother.  Hypertension Mother.  Pregnancy / Birth History Age at menarche 11 years.  Age of menopause 60-55  Gravida 4  Irregular periods  Length (months) of breastfeeding 3-6  Maternal age 73-25  Para 2  Other Problems  Asthma  High blood pressure  Sleep Apnea  Review of Systems  General Present- Weight Gain. Not Present- Appetite Loss, Chills, Fatigue, Fever, Night Sweats and Weight Loss.  Skin Not Present- Change in Wart/Mole, Dryness, Hives, Jaundice, New Lesions, Non-Healing Wounds, Rash and Ulcer.  HEENT Present-  Seasonal Allergies. Not Present- Earache, Hearing Loss, Hoarseness, Nose Bleed, Oral Ulcers, Ringing in the Ears, Sinus Pain, Sore  Throat, Visual Disturbances, Wears glasses/contact lenses and Yellow Eyes.  Respiratory Present- Wheezing. Not Present- Bloody sputum, Chronic Cough, Difficulty Breathing and Snoring.  Breast Not Present- Breast Mass, Breast Pain, Nipple Discharge and Skin Changes.  Cardiovascular Not Present- Chest Pain, Difficulty Breathing Lying Down, Leg Cramps, Palpitations, Rapid Heart Rate, Shortness of Breath and Swelling of Extremities.  Gastrointestinal Present- Bloating. Not Present- Abdominal Pain, Bloody Stool, Change in Bowel Habits, Chronic diarrhea, Constipation, Difficulty Swallowing, Excessive gas, Gets full quickly at meals, Hemorrhoids, Indigestion, Nausea, Rectal Pain and Vomiting.  Female Genitourinary Not Present- Frequency, Nocturia, Painful Urination, Pelvic Pain and Urgency.  Musculoskeletal Not Present- Back Pain, Joint Pain, Joint Stiffness, Muscle Pain, Muscle Weakness and Swelling of Extremities.  Neurological Present- Headaches. Not Present- Decreased Memory, Fainting, Numbness, Seizures, Tingling, Tremor, Trouble walking and Weakness.  Psychiatric Not Present- Anxiety, Bipolar, Change in Sleep Pattern, Depression, Fearful and Frequent crying.  Endocrine Present- Hot flashes. Not Present- Cold Intolerance, Excessive Hunger, Hair Changes, Heat Intolerance and New Diabetes.  Hematology Not Present- Blood Thinners, Easy Bruising, Excessive bleeding, Gland problems, HIV and Persistent Infections.  Vitals   02/25/2019 1:48 PM  Weight: 226.4 lb Height: 66 in  Body Surface Area: 2.11 m Body Mass Index: 36.54 kg/m  Temp.: 97.7 F Pulse: 64 (Regular)  BP: 144/80 (Sitting, Left Arm, Standard)  Physical Exam General  Mental Status - Alert.  General Appearance - Consistent with stated age.  Hydration - Well hydrated.  Voice - Normal.  Chest and Lung Exam  Inspection  Chest Wall - Inspection of the chest wall reveals - No Fluctuant masses. Shape - Normal. Movements - Normal. Accessory  muscles - No use of accessory muscles in breathing.  Cardiovascular  Cardiovascular examination reveals - on palpation PMI is normal in location and amplitude, no palpable S3 or S4. Normal cardiac borders., (see Vital Signs section for blood pressure measurements) and normal pedal pulses bilaterally.  Abdomen  Palpation/Percussion  Palpation and Percussion of the abdomen reveal - Soft, Non Tender, No Rebound tenderness and No Rigidity (guarding).  Neurologic  Neurologic evaluation reveals - alert and oriented x 3 with no impairment of recent or remote memory.  Mental Status - Normal.  Neuropsychiatric  The patient's mood and affect are described as - anxious.  Musculoskeletal  Normal Exam - Left - Upper Extremity Strength Normal and Lower Extremity Strength Normal.  Normal Exam - Right - Upper Extremity Strength Normal, Lower Extremity Weakness.  Assessment & Plan  SYMPTOMATIC CHOLELITHIASIS (K80.20)  Impression: The procedure has been discussed with the patient. Risks of laparoscopic cholecystectomy include bleeding, infection, bile duct injury, leak, death, open surgery, diarrhea, other surgery, organ injury, blood vessel injury, DVT, and additional care.  total time 40 minutes for face to face time consulting chart record labs etc  discuss surgery and non surgical options risks/ benefits and long term expectations  Current Plans  You are being scheduled for surgery - Our schedulers will call you.  You should hear from our office's scheduling department within 5 working days about the location, date, and time of surgery. We try to make accommodations for patient's preferences in scheduling surgery, but sometimes the OR schedule or the surgeon's schedule prevents [place] from making those accommodations.  If you have not heard from our office [phone] in 5 working days, call the office and ask for your surgeon's nurse.  If  you have other questions about your diagnosis, plan, or surgery, call  the office and ask for your surgeon's nurse.  Pt Education - Pamphlet Given - Laparoscopic Gallbladder Surgery: discussed with patient and provided information.  Pt Education - CCS Laparosopic Post Op HCI (Gross)  Pt Education - Laparoscopic Cholecystectomy: gallbladder  The anatomy & physiology of hepatobiliary & pancreatic function was discussed. The pathophysiology of gallbladder dysfunction was discussed. Natural history risks without surgery was discussed. I feel the risks of no intervention will lead to serious problems that outweigh the operative risks; therefore, I recommended cholecystectomy to remove the pathology. I explained laparoscopic techniques with possible need for an open approach. Probable cholangiogram to evaluate the bilary tract was explained as well.  Risks such as bleeding, infection, abscess, leak, injury to other organs, need for further treatment, heart attack, death, and other risks were discussed. I noted a good likelihood this will help address the problem. Possibility that this will not correct all abdominal symptoms was explained. Goals of post-operative recovery were discussed as well. We will work to minimize complications. An educational handout further explaining the pathology and treatment options was given as well. Questions were answered. The patient expresses understanding & wishes to proceed with surgery.

## 2019-05-02 NOTE — Anesthesia Preprocedure Evaluation (Signed)
Anesthesia Evaluation  Patient identified by MRN, date of birth, ID band Patient awake    Reviewed: Allergy & Precautions, NPO status , Patient's Chart, lab work & pertinent test results  History of Anesthesia Complications Negative for: history of anesthetic complications  Airway Mallampati: I  TM Distance: >3 FB Neck ROM: Full    Dental  (+) Teeth Intact   Pulmonary asthma , sleep apnea ,    Pulmonary exam normal        Cardiovascular hypertension, Normal cardiovascular exam     Neuro/Psych negative neurological ROS  negative psych ROS   GI/Hepatic negative GI ROS, Neg liver ROS,   Endo/Other  negative endocrine ROS  Renal/GU negative Renal ROS  negative genitourinary   Musculoskeletal  (+) Arthritis ,   Abdominal   Peds  Hematology negative hematology ROS (+)   Anesthesia Other Findings   Reproductive/Obstetrics                            Anesthesia Physical Anesthesia Plan  ASA: II  Anesthesia Plan: General   Post-op Pain Management:    Induction: Intravenous  PONV Risk Score and Plan: 3 and Ondansetron, Dexamethasone, Treatment may vary due to age or medical condition and Midazolam  Airway Management Planned: Oral ETT  Additional Equipment: None  Intra-op Plan:   Post-operative Plan: Extubation in OR  Informed Consent: I have reviewed the patients History and Physical, chart, labs and discussed the procedure including the risks, benefits and alternatives for the proposed anesthesia with the patient or authorized representative who has indicated his/her understanding and acceptance.     Dental advisory given  Plan Discussed with:   Anesthesia Plan Comments:         Anesthesia Quick Evaluation

## 2019-05-02 NOTE — Op Note (Signed)
Laparoscopic Cholecystectomy  Procedure Note  Indications: This patient presents with symptomatic gallbladder disease and will undergo laparoscopic cholecystectomy.The procedure has been discussed with the patient. Operative and non operative treatments have been discussed. Risks of surgery include bleeding, infection,  Common bile duct injury,  Injury to the stomach,liver, colon,small intestine, abdominal wall,  Diaphragm,  Major blood vessels,  And the need for an open procedure.  Other risks include worsening of medical problems, death,  DVT and pulmonary embolism, and cardiovascular events.   Medical options have also been discussed. The patient has been informed of long term expectations of surgery and non surgical options,  The patient agrees to proceed.    Pre-operative Diagnosis:  Calculus of gallbladder without mention of cholecystitis or obstruction   Post-operative Diagnosis: Calculus of gallbladder without mention of cholecystitis or obstruction  Surgeon: Turner Daniels MD  Assistants: Nevada Crane RNFA   Anesthesia: General endotracheal anesthesia and Local anesthesia 0.25.% bupivacaine, with epinephrine  ASA Class: 2  Procedure Details  The patient was seen again in the Holding Room. The risks, benefits, complications, treatment options, and expected outcomes were discussed with the patient. The possibilities of reaction to medication, pulmonary aspiration, perforation of viscus, bleeding, recurrent infection, finding a normal gallbladder, the need for additional procedures, failure to diagnose a condition, the possible need to convert to an open procedure, and creating a complication requiring transfusion or operation were discussed with the patient. The patient and/or family concurred with the proposed plan, giving informed consent. The site of surgery properly noted/marked. The patient was taken to Operating Room, identified as Nilda Riggs and the procedure verified as Laparoscopic  Cholecystectomy with Intraoperative Cholangiograms. A Time Out was held and the above information confirmed.  Prior to the induction of general anesthesia, antibiotic prophylaxis was administered. General endotracheal anesthesia was then administered and tolerated well. After the induction, the abdomen was prepped in the usual sterile fashion. The patient was positioned in the supine position with the left arm comfortably tucked, along with some reverse Trendelenburg.  Local anesthetic agent was injected into the skin near the umbilicus and an incision made. The midline fascia was incised and the Hasson technique was used to introduce a 12 mm port under direct vision. It was secured with a figure of eight Vicryl suture placed in the usual fashion. Pneumoperitoneum was then created with CO2 and tolerated well without any adverse changes in the patient's vital signs. Additional trocars were introduced under direct vision with an 11 mm trocar in the epigastrium and two  5 mm trocars in the right upper quadrant. All skin incisions were infiltrated with a local anesthetic agent before making the incision and placing the trocars.   The gallbladder was identified, the fundus grasped and retracted cephalad. Adhesions were lysed bluntly and with the electrocautery where indicated, taking care not to injure any adjacent organs or viscus. The infundibulum was grasped and retracted laterally, exposing the peritoneum overlying the triangle of Calot. This was then divided and exposed in a blunt fashion. The cystic duct was clearly identified and bluntly dissected circumferentially. The junctions of the gallbladder, cystic duct and common bile duct were clearly identified prior to the division of any linear structure.   The cystic duct was identified and was extremely small and the LFT / U/S were within normal limits.  The critical view was obtained.  Cholangiogram was not performed.    The cystic duct was then  ligated  with surgical clips  on the patient side  and  clipped on the gallbladder side and divided. The cystic artery was identified, dissected free, ligated with clips and divided as well. Posterior cystic artery clipped and divided.  The gallbladder was dissected from the liver bed in retrograde fashion with the electrocautery. The gallbladder was removed. The liver bed was irrigated and inspected. Hemostasis was achieved with the electrocautery. Copious irrigation was utilized and was repeatedly aspirated until clear all particulate matter. Hemostasis was achieved with no signs of bleeding or bile leakage.  Pneumoperitoneum was completely reduced after viewing removal of the trocars under direct vision. The wound was thoroughly irrigated and the fascia was then closed with a figure of eight suture; the skin was then closed with 4- O  and a sterile dressing was applied.  Instrument, sponge, and needle counts were correct at closure and at the conclusion of the case.   Findings:   Cholelithiasis  Estimated Blood Loss: less than 50 mL         Drains: none          Total IV Fluids: per record          Specimens: Gallbladder           Complications: None; patient tolerated the procedure well.         Disposition: PACU - hemodynamically stable.         Condition: stable

## 2019-05-02 NOTE — Discharge Instructions (Signed)
Tylenol given at 9:15 this morning. Take next dose if needed anytime after 3:15pm.   Post Anesthesia Home Care Instructions  Activity: Get plenty of rest for the remainder of the day. A responsible individual must stay with you for 24 hours following the procedure.  For the next 24 hours, DO NOT: -Drive a car -Paediatric nurse -Drink alcoholic beverages -Take any medication unless instructed by your physician -Make any legal decisions or sign important papers.  Meals: Start with liquid foods such as gelatin or soup. Progress to regular foods as tolerated. Avoid greasy, spicy, heavy foods. If nausea and/or vomiting occur, drink only clear liquids until the nausea and/or vomiting subsides. Call your physician if vomiting continues.  Special Instructions/Symptoms: Your throat may feel dry or sore from the anesthesia or the breathing tube placed in your throat during surgery. If this causes discomfort, gargle with warm salt water. The discomfort should disappear within 24 hours.  If you had a scopolamine patch placed behind your ear for the management of post- operative nausea and/or vomiting:  1. The medication in the patch is effective for 72 hours, after which it should be removed.  Wrap patch in a tissue and discard in the trash. Wash hands thoroughly with soap and water. 2. You may remove the patch earlier than 72 hours if you experience unpleasant side effects which may include dry mouth, dizziness or visual disturbances. 3. Avoid touching the patch. Wash your hands with soap and water after contact with the patch.    [facility] ______[facility] LAPAROSCOPIC SURGERY: POST OP INSTRUCTIONS Always review your discharge instruction sheet given to you by the facility where your surgery was performed. IF YOU HAVE DISABILITY OR FAMILY LEAVE FORMS, YOU MUST BRING THEM TO THE OFFICE FOR PROCESSING.   DO NOT GIVE THEM TO YOUR DOCTOR.  1. A prescription for pain medication may be  given to you upon discharge.  Take your pain medication as prescribed, if needed.  If narcotic pain medicine is not needed, then you may take acetaminophen (Tylenol) or ibuprofen (Advil) as needed. 2. Take your usually prescribed medications unless otherwise directed. 3. If you need a refill on your pain medication, please contact your pharmacy.  They will contact our office to request authorization. Prescriptions will not be filled after 5pm or on week-ends. 4. You should follow a light diet the first few days after arrival home, such as soup and crackers, etc.  Be sure to include lots of fluids daily. 5. Most patients will experience some swelling and bruising in the area of the incisions.  Ice packs will help.  Swelling and bruising can take several days to resolve.  6. It is common to experience some constipation if taking pain medication after surgery.  Increasing fluid intake and taking a stool softener (such as Colace) will usually help or prevent this problem from occurring.  A mild laxative (Milk of Magnesia or Miralax) should be taken according to package instructions if there are no bowel movements after 48 hours. 7. Unless discharge instructions indicate otherwise, you may remove your bandages 24-48 hours after surgery, and you may shower at that time.  You may have steri-strips (small skin tapes) in place directly over the incision.  These strips should be left on the skin for 7-10 days.  If your surgeon used skin glue on the incision, you may shower in 24 hours.  The glue will flake off over the next 2-3 weeks.  Any sutures or staples will  be removed at the office during your follow-up visit. 8. ACTIVITIES:  You may resume regular (light) daily activities beginning the next day--such as daily self-care, walking, climbing stairs--gradually increasing activities as tolerated.  You may have sexual intercourse when it is comfortable.  Refrain from any heavy lifting or straining until approved by  your doctor. a. You may drive when you are no longer taking prescription pain medication, you can comfortably wear a seatbelt, and you can safely maneuver your car and apply brakes. b. RETURN TO WORK:  __________________________________________________________ 9. You should see your doctor in the office for a follow-up appointment approximately 2-3 weeks after your surgery.  Make sure that you call for this appointment within a day or two after you arrive home to insure a convenient appointment time. 10. OTHER INSTRUCTIONS: __________________________________________________________________________________________________________________________ __________________________________________________________________________________________________________________________ WHEN TO CALL YOUR DOCTOR: 1. Fever over 101.0 2. Inability to urinate 3. Continued bleeding from incision. 4. Increased pain, redness, or drainage from the incision. 5. Increasing abdominal pain  The clinic staff is available to answer your questions during regular business hours.  Please don't hesitate to call and ask to speak to one of the nurses for clinical concerns.  If you have a medical emergency, go to the nearest emergency room or call 911.  A surgeon from [facility] is always on call at the hospital. [address], [address], [place], [place]  [address] ? [address] [address], [place], [place]   [address] [phone] ? [phone] ? FAX [phone] Web site: [email or web address]

## 2019-05-02 NOTE — Anesthesia Postprocedure Evaluation (Signed)
Anesthesia Post Note  Patient: Breanna Jones  Procedure(s) Performed: LAPAROSCOPIC CHOLECYSTECTOMY (N/A Abdomen)     Patient location during evaluation: PACU Anesthesia Type: General Level of consciousness: awake and alert Pain management: pain level controlled Vital Signs Assessment: post-procedure vital signs reviewed and stable Respiratory status: spontaneous breathing, nonlabored ventilation and respiratory function stable Cardiovascular status: blood pressure returned to baseline and stable Postop Assessment: no apparent nausea or vomiting Anesthetic complications: no    Last Vitals:  Vitals:   05/02/19 1230 05/02/19 1330  BP: 136/70 (!) 146/72  Pulse: 69 68  Resp: 15 (!) 22  Temp:  36.7 C  SpO2: 100% 98%    Last Pain:  Vitals:   05/02/19 1330  PainSc: 5                  Tonishia Steffy E Bubba Vanbenschoten

## 2019-05-06 LAB — SURGICAL PATHOLOGY

## 2019-08-02 DIAGNOSIS — M62838 Other muscle spasm: Secondary | ICD-10-CM | POA: Diagnosis not present

## 2019-08-13 DIAGNOSIS — E559 Vitamin D deficiency, unspecified: Secondary | ICD-10-CM | POA: Diagnosis not present

## 2019-08-13 DIAGNOSIS — Z23 Encounter for immunization: Secondary | ICD-10-CM | POA: Diagnosis not present

## 2019-08-13 DIAGNOSIS — Z13220 Encounter for screening for lipoid disorders: Secondary | ICD-10-CM | POA: Diagnosis not present

## 2019-08-13 DIAGNOSIS — Z0000 Encounter for general adult medical examination without abnormal findings: Secondary | ICD-10-CM | POA: Diagnosis not present

## 2019-08-14 ENCOUNTER — Other Ambulatory Visit: Payer: Self-pay | Admitting: Family Medicine

## 2019-08-14 DIAGNOSIS — N390 Urinary tract infection, site not specified: Secondary | ICD-10-CM | POA: Diagnosis not present

## 2019-08-14 DIAGNOSIS — R0781 Pleurodynia: Secondary | ICD-10-CM

## 2019-08-14 DIAGNOSIS — Z0000 Encounter for general adult medical examination without abnormal findings: Secondary | ICD-10-CM | POA: Diagnosis not present

## 2019-08-14 DIAGNOSIS — R0789 Other chest pain: Secondary | ICD-10-CM

## 2019-08-21 DIAGNOSIS — R011 Cardiac murmur, unspecified: Secondary | ICD-10-CM | POA: Diagnosis not present

## 2019-08-26 ENCOUNTER — Other Ambulatory Visit: Payer: Self-pay

## 2019-08-26 ENCOUNTER — Ambulatory Visit
Admission: RE | Admit: 2019-08-26 | Discharge: 2019-08-26 | Disposition: A | Discharge status: Home / Self Care | Payer: Federal, State, Local not specified - PPO | Source: Ambulatory Visit | Attending: Family Medicine | Admitting: Family Medicine

## 2019-08-26 DIAGNOSIS — R0789 Other chest pain: Secondary | ICD-10-CM

## 2019-08-26 DIAGNOSIS — R911 Solitary pulmonary nodule: Secondary | ICD-10-CM | POA: Diagnosis not present

## 2019-08-26 DIAGNOSIS — R0781 Pleurodynia: Secondary | ICD-10-CM

## 2019-08-30 DIAGNOSIS — R0789 Other chest pain: Secondary | ICD-10-CM | POA: Diagnosis not present

## 2019-08-30 DIAGNOSIS — M25512 Pain in left shoulder: Secondary | ICD-10-CM | POA: Diagnosis not present

## 2019-08-30 DIAGNOSIS — R140 Abdominal distension (gaseous): Secondary | ICD-10-CM | POA: Diagnosis not present

## 2019-08-30 DIAGNOSIS — J4520 Mild intermittent asthma, uncomplicated: Secondary | ICD-10-CM | POA: Diagnosis not present

## 2019-09-03 DIAGNOSIS — N390 Urinary tract infection, site not specified: Secondary | ICD-10-CM | POA: Diagnosis not present

## 2019-09-16 DIAGNOSIS — Z20822 Contact with and (suspected) exposure to covid-19: Secondary | ICD-10-CM | POA: Diagnosis not present

## 2019-09-25 DIAGNOSIS — M4722 Other spondylosis with radiculopathy, cervical region: Secondary | ICD-10-CM | POA: Diagnosis not present

## 2019-09-25 DIAGNOSIS — M5030 Other cervical disc degeneration, unspecified cervical region: Secondary | ICD-10-CM | POA: Diagnosis not present

## 2019-09-25 DIAGNOSIS — M4802 Spinal stenosis, cervical region: Secondary | ICD-10-CM | POA: Diagnosis not present

## 2019-09-25 DIAGNOSIS — M47812 Spondylosis without myelopathy or radiculopathy, cervical region: Secondary | ICD-10-CM | POA: Diagnosis not present

## 2019-10-17 DIAGNOSIS — R1011 Right upper quadrant pain: Secondary | ICD-10-CM | POA: Diagnosis not present

## 2019-10-17 DIAGNOSIS — R140 Abdominal distension (gaseous): Secondary | ICD-10-CM | POA: Diagnosis not present

## 2019-10-17 DIAGNOSIS — K5900 Constipation, unspecified: Secondary | ICD-10-CM | POA: Diagnosis not present

## 2019-10-17 DIAGNOSIS — Z1159 Encounter for screening for other viral diseases: Secondary | ICD-10-CM | POA: Diagnosis not present

## 2019-10-21 DIAGNOSIS — R1013 Epigastric pain: Secondary | ICD-10-CM | POA: Diagnosis not present

## 2019-10-21 DIAGNOSIS — K2930 Chronic superficial gastritis without bleeding: Secondary | ICD-10-CM | POA: Diagnosis not present

## 2019-10-21 DIAGNOSIS — K317 Polyp of stomach and duodenum: Secondary | ICD-10-CM | POA: Diagnosis not present

## 2019-10-21 DIAGNOSIS — R1011 Right upper quadrant pain: Secondary | ICD-10-CM | POA: Diagnosis not present

## 2019-10-21 DIAGNOSIS — Z1211 Encounter for screening for malignant neoplasm of colon: Secondary | ICD-10-CM | POA: Diagnosis not present

## 2019-10-22 DIAGNOSIS — R8279 Other abnormal findings on microbiological examination of urine: Secondary | ICD-10-CM | POA: Diagnosis not present

## 2019-10-22 DIAGNOSIS — Z01419 Encounter for gynecological examination (general) (routine) without abnormal findings: Secondary | ICD-10-CM | POA: Diagnosis not present

## 2019-10-22 DIAGNOSIS — Z1231 Encounter for screening mammogram for malignant neoplasm of breast: Secondary | ICD-10-CM | POA: Diagnosis not present

## 2019-10-29 DIAGNOSIS — M4722 Other spondylosis with radiculopathy, cervical region: Secondary | ICD-10-CM | POA: Diagnosis not present

## 2019-10-29 DIAGNOSIS — M542 Cervicalgia: Secondary | ICD-10-CM | POA: Diagnosis not present

## 2019-10-31 DIAGNOSIS — M542 Cervicalgia: Secondary | ICD-10-CM | POA: Diagnosis not present

## 2019-10-31 DIAGNOSIS — M4722 Other spondylosis with radiculopathy, cervical region: Secondary | ICD-10-CM | POA: Diagnosis not present

## 2019-11-05 DIAGNOSIS — M4722 Other spondylosis with radiculopathy, cervical region: Secondary | ICD-10-CM | POA: Diagnosis not present

## 2019-11-05 DIAGNOSIS — M542 Cervicalgia: Secondary | ICD-10-CM | POA: Diagnosis not present

## 2019-11-08 DIAGNOSIS — M542 Cervicalgia: Secondary | ICD-10-CM | POA: Diagnosis not present

## 2019-11-08 DIAGNOSIS — M4722 Other spondylosis with radiculopathy, cervical region: Secondary | ICD-10-CM | POA: Diagnosis not present

## 2019-11-12 DIAGNOSIS — M542 Cervicalgia: Secondary | ICD-10-CM | POA: Diagnosis not present

## 2019-11-12 DIAGNOSIS — M4722 Other spondylosis with radiculopathy, cervical region: Secondary | ICD-10-CM | POA: Diagnosis not present

## 2019-11-19 DIAGNOSIS — M4722 Other spondylosis with radiculopathy, cervical region: Secondary | ICD-10-CM | POA: Diagnosis not present

## 2019-11-19 DIAGNOSIS — M542 Cervicalgia: Secondary | ICD-10-CM | POA: Diagnosis not present

## 2019-11-21 DIAGNOSIS — M4722 Other spondylosis with radiculopathy, cervical region: Secondary | ICD-10-CM | POA: Diagnosis not present

## 2019-11-21 DIAGNOSIS — M542 Cervicalgia: Secondary | ICD-10-CM | POA: Diagnosis not present

## 2019-12-10 DIAGNOSIS — M4722 Other spondylosis with radiculopathy, cervical region: Secondary | ICD-10-CM | POA: Diagnosis not present

## 2019-12-10 DIAGNOSIS — M542 Cervicalgia: Secondary | ICD-10-CM | POA: Diagnosis not present

## 2019-12-23 DIAGNOSIS — G4700 Insomnia, unspecified: Secondary | ICD-10-CM | POA: Diagnosis not present

## 2019-12-23 DIAGNOSIS — J45909 Unspecified asthma, uncomplicated: Secondary | ICD-10-CM | POA: Diagnosis not present

## 2019-12-24 DIAGNOSIS — M4722 Other spondylosis with radiculopathy, cervical region: Secondary | ICD-10-CM | POA: Diagnosis not present

## 2019-12-24 DIAGNOSIS — M542 Cervicalgia: Secondary | ICD-10-CM | POA: Diagnosis not present

## 2020-01-13 ENCOUNTER — Other Ambulatory Visit: Payer: Self-pay | Admitting: Physician Assistant

## 2020-01-13 DIAGNOSIS — R140 Abdominal distension (gaseous): Secondary | ICD-10-CM | POA: Diagnosis not present

## 2020-01-13 DIAGNOSIS — R1011 Right upper quadrant pain: Secondary | ICD-10-CM | POA: Diagnosis not present

## 2020-01-13 DIAGNOSIS — K5900 Constipation, unspecified: Secondary | ICD-10-CM | POA: Diagnosis not present

## 2020-01-13 DIAGNOSIS — R1900 Intra-abdominal and pelvic swelling, mass and lump, unspecified site: Secondary | ICD-10-CM | POA: Diagnosis not present

## 2020-01-15 DIAGNOSIS — J4520 Mild intermittent asthma, uncomplicated: Secondary | ICD-10-CM | POA: Diagnosis not present

## 2020-01-21 DIAGNOSIS — M542 Cervicalgia: Secondary | ICD-10-CM | POA: Diagnosis not present

## 2020-01-21 DIAGNOSIS — M4722 Other spondylosis with radiculopathy, cervical region: Secondary | ICD-10-CM | POA: Diagnosis not present

## 2020-01-30 ENCOUNTER — Other Ambulatory Visit: Payer: Self-pay

## 2020-01-30 ENCOUNTER — Ambulatory Visit
Admission: RE | Admit: 2020-01-30 | Discharge: 2020-01-30 | Disposition: A | Discharge status: Home / Self Care | Payer: Federal, State, Local not specified - PPO | Source: Ambulatory Visit | Attending: Physician Assistant | Admitting: Physician Assistant

## 2020-01-30 DIAGNOSIS — R140 Abdominal distension (gaseous): Secondary | ICD-10-CM

## 2020-01-30 DIAGNOSIS — R1011 Right upper quadrant pain: Secondary | ICD-10-CM | POA: Diagnosis not present

## 2020-01-30 DIAGNOSIS — Z9049 Acquired absence of other specified parts of digestive tract: Secondary | ICD-10-CM | POA: Diagnosis not present

## 2020-01-30 MED ORDER — IOPAMIDOL (ISOVUE-300) INJECTION 61%
100.0000 mL | Freq: Once | INTRAVENOUS | Status: AC | PRN
  Administered 2020-01-30: 100 mL via INTRAVENOUS

## 2020-02-17 DIAGNOSIS — M4722 Other spondylosis with radiculopathy, cervical region: Secondary | ICD-10-CM | POA: Diagnosis not present

## 2020-02-17 DIAGNOSIS — M542 Cervicalgia: Secondary | ICD-10-CM | POA: Diagnosis not present

## 2020-03-04 DIAGNOSIS — M62838 Other muscle spasm: Secondary | ICD-10-CM | POA: Diagnosis not present

## 2020-04-13 DIAGNOSIS — K5900 Constipation, unspecified: Secondary | ICD-10-CM | POA: Diagnosis not present

## 2020-04-13 DIAGNOSIS — R1011 Right upper quadrant pain: Secondary | ICD-10-CM | POA: Diagnosis not present

## 2020-04-13 DIAGNOSIS — R1013 Epigastric pain: Secondary | ICD-10-CM | POA: Diagnosis not present

## 2020-04-29 DIAGNOSIS — R140 Abdominal distension (gaseous): Secondary | ICD-10-CM | POA: Diagnosis not present

## 2020-04-29 DIAGNOSIS — R1011 Right upper quadrant pain: Secondary | ICD-10-CM | POA: Diagnosis not present

## 2020-05-05 ENCOUNTER — Other Ambulatory Visit: Payer: Self-pay | Admitting: Family Medicine

## 2020-05-05 DIAGNOSIS — R1011 Right upper quadrant pain: Secondary | ICD-10-CM

## 2020-05-18 ENCOUNTER — Other Ambulatory Visit: Payer: Federal, State, Local not specified - PPO

## 2020-05-25 DIAGNOSIS — J45901 Unspecified asthma with (acute) exacerbation: Secondary | ICD-10-CM | POA: Diagnosis not present

## 2020-05-25 DIAGNOSIS — J4520 Mild intermittent asthma, uncomplicated: Secondary | ICD-10-CM | POA: Diagnosis not present

## 2020-05-25 DIAGNOSIS — T148XXA Other injury of unspecified body region, initial encounter: Secondary | ICD-10-CM | POA: Diagnosis not present

## 2020-06-01 ENCOUNTER — Other Ambulatory Visit: Payer: Self-pay

## 2020-06-01 ENCOUNTER — Ambulatory Visit
Admission: RE | Admit: 2020-06-01 | Discharge: 2020-06-01 | Disposition: A | Discharge status: Home / Self Care | Payer: Federal, State, Local not specified - PPO | Source: Ambulatory Visit | Attending: Family Medicine | Admitting: Family Medicine

## 2020-06-01 DIAGNOSIS — R1110 Vomiting, unspecified: Secondary | ICD-10-CM | POA: Diagnosis not present

## 2020-06-01 DIAGNOSIS — K449 Diaphragmatic hernia without obstruction or gangrene: Secondary | ICD-10-CM | POA: Diagnosis not present

## 2020-06-01 DIAGNOSIS — Z9049 Acquired absence of other specified parts of digestive tract: Secondary | ICD-10-CM | POA: Diagnosis not present

## 2020-06-01 DIAGNOSIS — R1011 Right upper quadrant pain: Secondary | ICD-10-CM | POA: Diagnosis not present

## 2020-06-19 DIAGNOSIS — R1011 Right upper quadrant pain: Secondary | ICD-10-CM | POA: Diagnosis not present

## 2020-08-10 DIAGNOSIS — R1011 Right upper quadrant pain: Secondary | ICD-10-CM | POA: Diagnosis not present

## 2020-08-10 DIAGNOSIS — G8929 Other chronic pain: Secondary | ICD-10-CM | POA: Diagnosis not present

## 2020-08-10 DIAGNOSIS — K5900 Constipation, unspecified: Secondary | ICD-10-CM | POA: Diagnosis not present

## 2020-08-28 DIAGNOSIS — M25562 Pain in left knee: Secondary | ICD-10-CM | POA: Diagnosis not present

## 2020-10-13 DIAGNOSIS — G8929 Other chronic pain: Secondary | ICD-10-CM | POA: Diagnosis not present

## 2020-10-13 DIAGNOSIS — M25562 Pain in left knee: Secondary | ICD-10-CM | POA: Diagnosis not present

## 2020-12-07 DIAGNOSIS — Z01419 Encounter for gynecological examination (general) (routine) without abnormal findings: Secondary | ICD-10-CM | POA: Diagnosis not present

## 2020-12-07 DIAGNOSIS — N898 Other specified noninflammatory disorders of vagina: Secondary | ICD-10-CM | POA: Diagnosis not present

## 2020-12-07 DIAGNOSIS — Z1231 Encounter for screening mammogram for malignant neoplasm of breast: Secondary | ICD-10-CM | POA: Diagnosis not present

## 2020-12-14 DIAGNOSIS — G8929 Other chronic pain: Secondary | ICD-10-CM | POA: Diagnosis not present

## 2020-12-14 DIAGNOSIS — J0110 Acute frontal sinusitis, unspecified: Secondary | ICD-10-CM | POA: Diagnosis not present

## 2020-12-21 DIAGNOSIS — J4540 Moderate persistent asthma, uncomplicated: Secondary | ICD-10-CM | POA: Diagnosis not present

## 2020-12-21 DIAGNOSIS — R21 Rash and other nonspecific skin eruption: Secondary | ICD-10-CM | POA: Diagnosis not present

## 2020-12-21 DIAGNOSIS — Z1211 Encounter for screening for malignant neoplasm of colon: Secondary | ICD-10-CM | POA: Diagnosis not present

## 2020-12-21 DIAGNOSIS — Z124 Encounter for screening for malignant neoplasm of cervix: Secondary | ICD-10-CM | POA: Diagnosis not present

## 2020-12-21 DIAGNOSIS — I10 Essential (primary) hypertension: Secondary | ICD-10-CM | POA: Diagnosis not present

## 2020-12-21 DIAGNOSIS — Z1231 Encounter for screening mammogram for malignant neoplasm of breast: Secondary | ICD-10-CM | POA: Diagnosis not present

## 2020-12-21 DIAGNOSIS — Z0000 Encounter for general adult medical examination without abnormal findings: Secondary | ICD-10-CM | POA: Diagnosis not present

## 2020-12-22 DIAGNOSIS — Z13220 Encounter for screening for lipoid disorders: Secondary | ICD-10-CM | POA: Diagnosis not present

## 2020-12-22 DIAGNOSIS — Z0000 Encounter for general adult medical examination without abnormal findings: Secondary | ICD-10-CM | POA: Diagnosis not present

## 2020-12-22 DIAGNOSIS — I10 Essential (primary) hypertension: Secondary | ICD-10-CM | POA: Diagnosis not present

## 2021-01-04 DIAGNOSIS — L821 Other seborrheic keratosis: Secondary | ICD-10-CM | POA: Diagnosis not present

## 2021-01-04 DIAGNOSIS — D225 Melanocytic nevi of trunk: Secondary | ICD-10-CM | POA: Diagnosis not present

## 2021-01-22 DIAGNOSIS — Z8709 Personal history of other diseases of the respiratory system: Secondary | ICD-10-CM | POA: Diagnosis not present

## 2021-01-22 DIAGNOSIS — L089 Local infection of the skin and subcutaneous tissue, unspecified: Secondary | ICD-10-CM | POA: Diagnosis not present

## 2021-01-28 DIAGNOSIS — L03811 Cellulitis of head [any part, except face]: Secondary | ICD-10-CM | POA: Diagnosis not present

## 2021-03-16 DIAGNOSIS — J029 Acute pharyngitis, unspecified: Secondary | ICD-10-CM | POA: Diagnosis not present

## 2021-06-03 DIAGNOSIS — J4540 Moderate persistent asthma, uncomplicated: Secondary | ICD-10-CM | POA: Diagnosis not present

## 2021-06-03 DIAGNOSIS — I10 Essential (primary) hypertension: Secondary | ICD-10-CM | POA: Diagnosis not present

## 2021-06-07 DIAGNOSIS — R140 Abdominal distension (gaseous): Secondary | ICD-10-CM | POA: Diagnosis not present

## 2021-06-07 DIAGNOSIS — R1011 Right upper quadrant pain: Secondary | ICD-10-CM | POA: Diagnosis not present

## 2021-06-07 DIAGNOSIS — K5989 Other specified functional intestinal disorders: Secondary | ICD-10-CM | POA: Diagnosis not present

## 2021-06-07 DIAGNOSIS — G8929 Other chronic pain: Secondary | ICD-10-CM | POA: Diagnosis not present

## 2021-08-16 DIAGNOSIS — R1013 Epigastric pain: Secondary | ICD-10-CM | POA: Diagnosis not present

## 2021-08-16 DIAGNOSIS — R140 Abdominal distension (gaseous): Secondary | ICD-10-CM | POA: Diagnosis not present

## 2021-09-08 DIAGNOSIS — J4540 Moderate persistent asthma, uncomplicated: Secondary | ICD-10-CM | POA: Diagnosis not present

## 2021-09-08 DIAGNOSIS — I10 Essential (primary) hypertension: Secondary | ICD-10-CM | POA: Diagnosis not present

## 2021-09-08 DIAGNOSIS — R519 Headache, unspecified: Secondary | ICD-10-CM | POA: Diagnosis not present

## 2021-09-10 ENCOUNTER — Other Ambulatory Visit: Payer: Self-pay | Admitting: Family Medicine

## 2021-09-10 DIAGNOSIS — R519 Headache, unspecified: Secondary | ICD-10-CM

## 2021-09-20 DIAGNOSIS — I10 Essential (primary) hypertension: Secondary | ICD-10-CM | POA: Diagnosis not present

## 2021-10-04 ENCOUNTER — Ambulatory Visit
Admission: RE | Admit: 2021-10-04 | Discharge: 2021-10-04 | Disposition: A | Discharge status: Home / Self Care | Payer: Federal, State, Local not specified - PPO | Source: Ambulatory Visit | Attending: Family Medicine | Admitting: Family Medicine

## 2021-10-04 DIAGNOSIS — R519 Headache, unspecified: Secondary | ICD-10-CM

## 2021-10-18 DIAGNOSIS — I10 Essential (primary) hypertension: Secondary | ICD-10-CM | POA: Diagnosis not present

## 2021-10-18 DIAGNOSIS — Z6838 Body mass index (BMI) 38.0-38.9, adult: Secondary | ICD-10-CM | POA: Diagnosis not present

## 2021-11-22 DIAGNOSIS — Z6838 Body mass index (BMI) 38.0-38.9, adult: Secondary | ICD-10-CM | POA: Diagnosis not present

## 2021-11-22 DIAGNOSIS — I10 Essential (primary) hypertension: Secondary | ICD-10-CM | POA: Diagnosis not present

## 2021-12-13 DIAGNOSIS — Z01419 Encounter for gynecological examination (general) (routine) without abnormal findings: Secondary | ICD-10-CM | POA: Diagnosis not present

## 2021-12-13 DIAGNOSIS — Z1231 Encounter for screening mammogram for malignant neoplasm of breast: Secondary | ICD-10-CM | POA: Diagnosis not present

## 2021-12-13 IMAGING — CT CT CHEST W/O CM
1 series · 15 of 34 positions shown, 19 images · non-contrast
Comparison: February 09, 2018.

CLINICAL DATA: Palpable knot in right lower rib region.

EXAM:
CT CHEST WITHOUT CONTRAST
TECHNIQUE: Multidetector CT imaging of the chest was performed following the
standard protocol without IV contrast.

[Series 2: chest w/(date) · axial · 0.71mm/px · z∈[-295,-53]mm · 15 of 143 slices shown, 19 images]
[im 11/143  mediastinal]
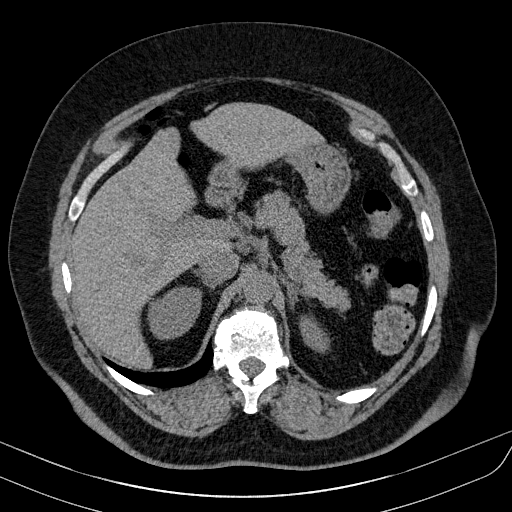
[im 11/143  lung]
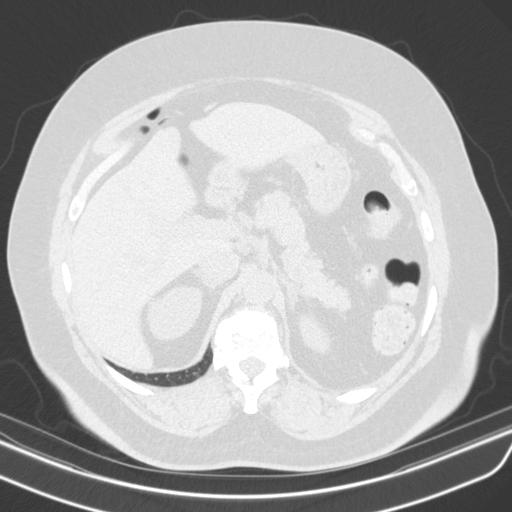
[im 22/143  lung]
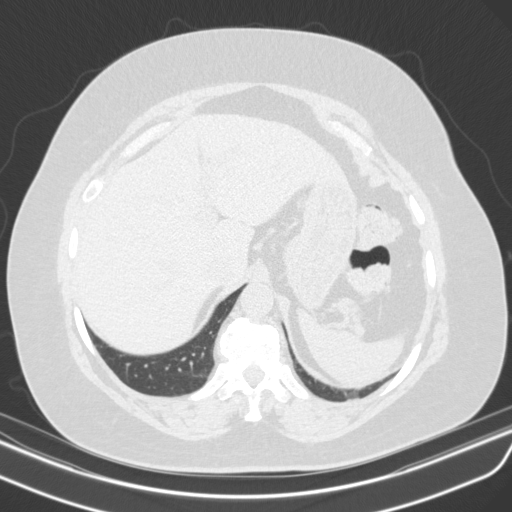
[im 29/143  lung]
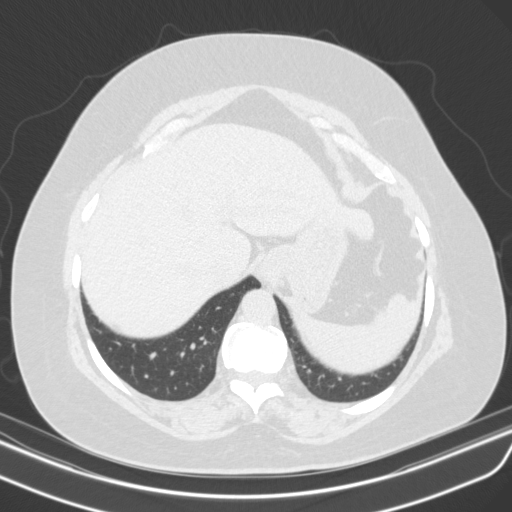
[im 37/143  lung]
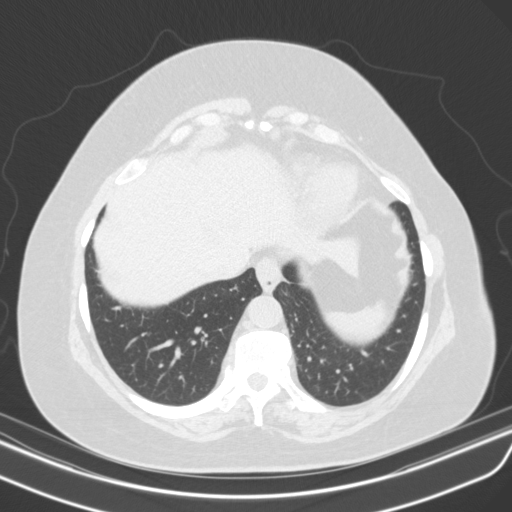
[im 48/143  mediastinal]
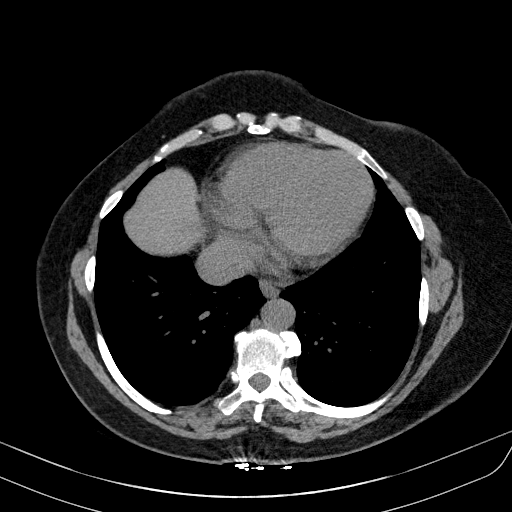
[im 48/143  lung]
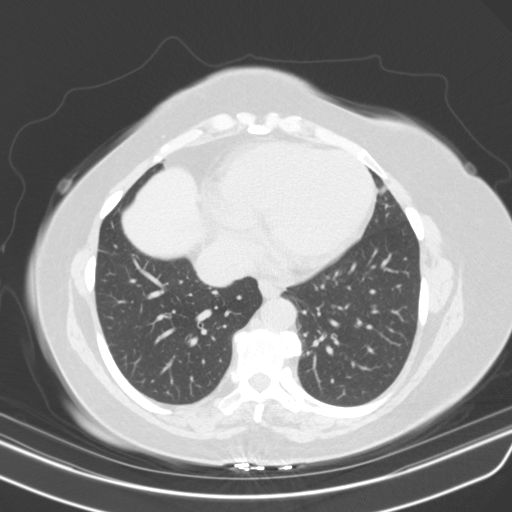
[im 57/143  lung]
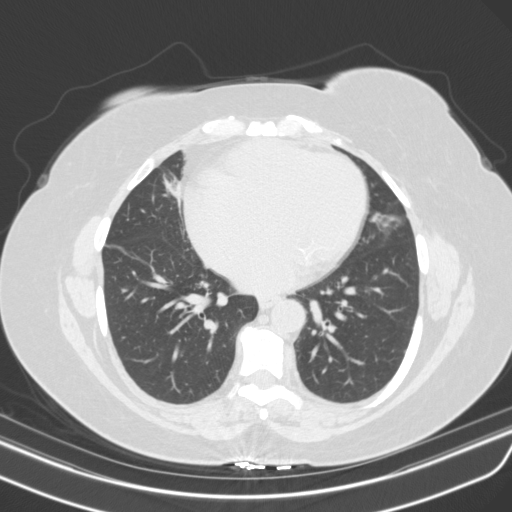
[im 64/143  lung]
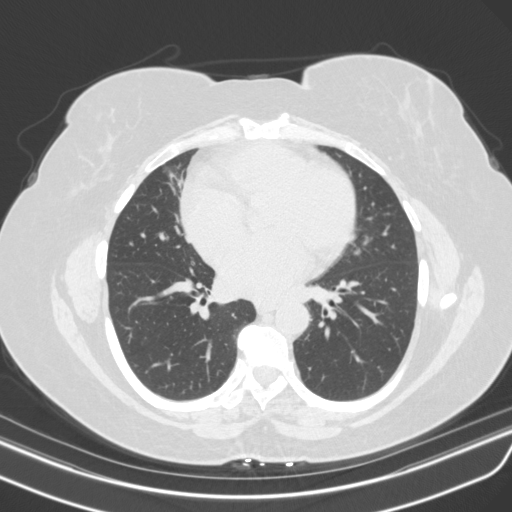
[im 74/143  lung]
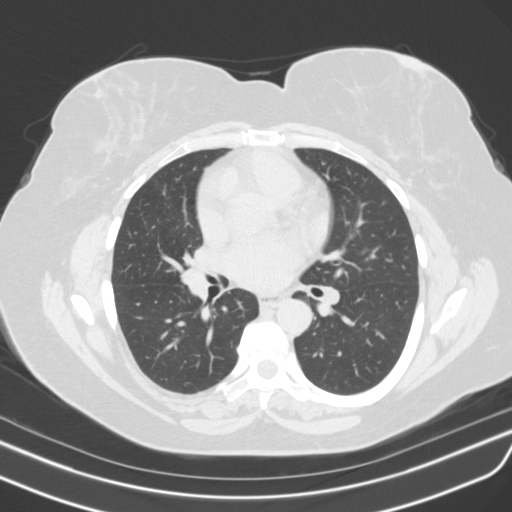
[im 79/143  mediastinal]
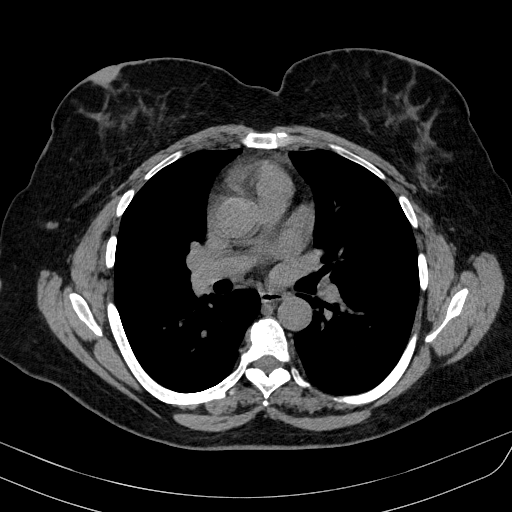
[im 79/143  lung]
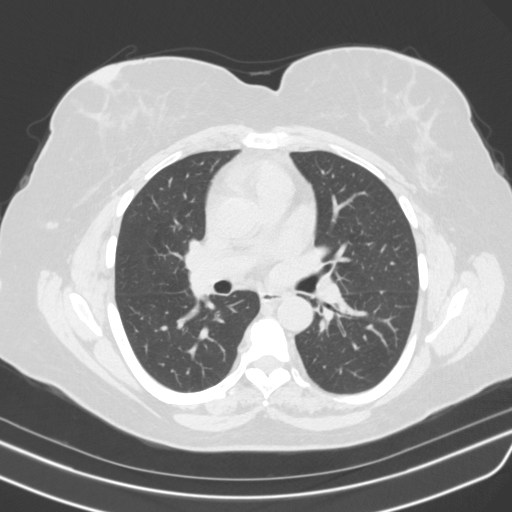
[im 86/143  lung]
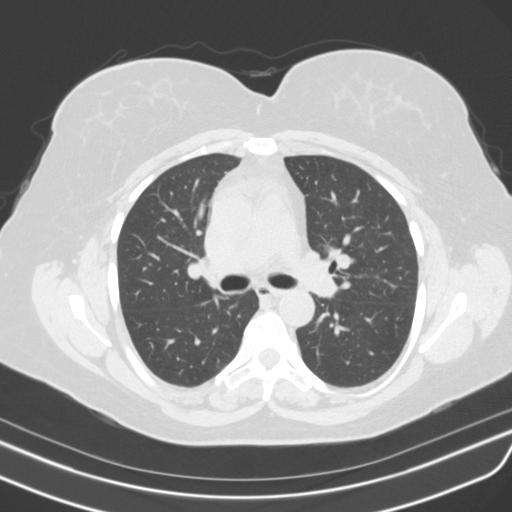
[im 95/143  lung]
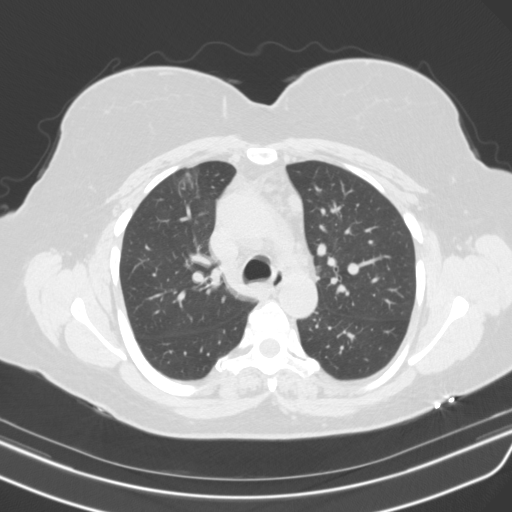
[im 106/143  lung]
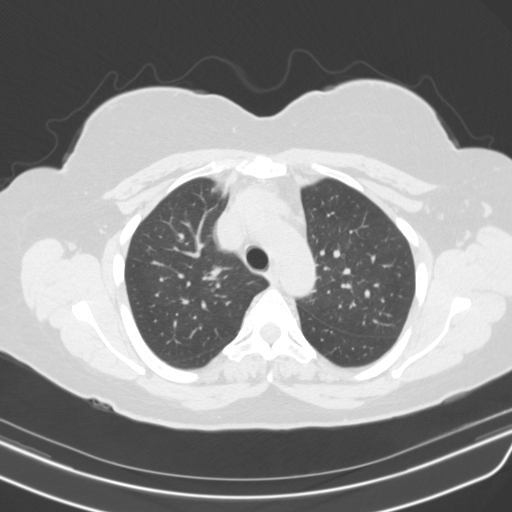
[im 114/143  mediastinal]
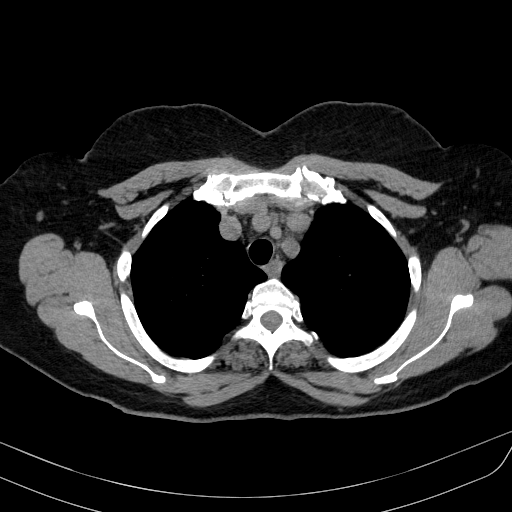
[im 114/143  lung]
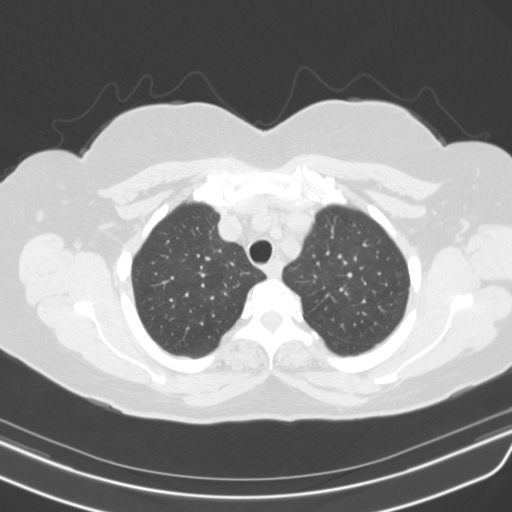
[im 121/143  lung]
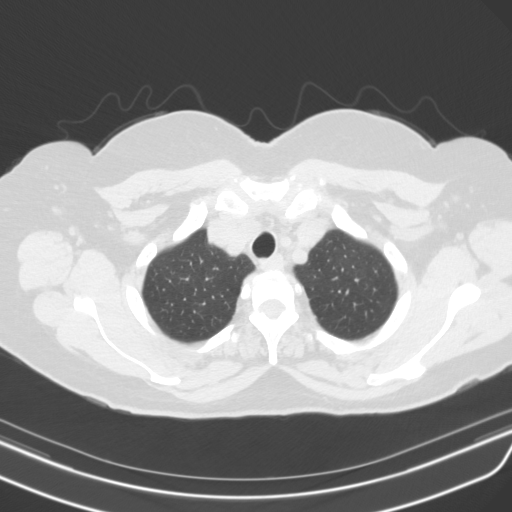
[im 132/143  lung]
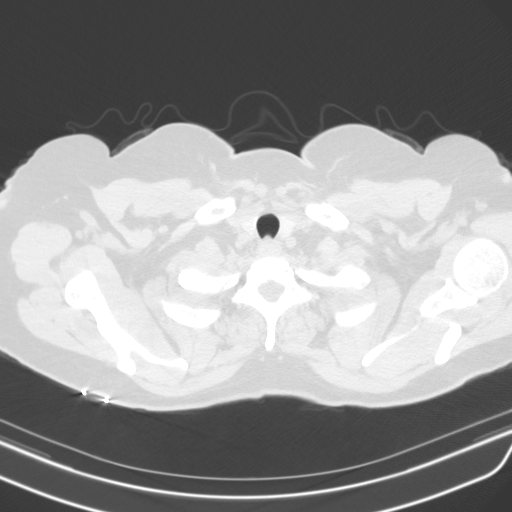

[15 of 34 positions shown; findings below may reference images not displayed]

FINDINGS: Cardiovascular: No significant vascular findings. Normal heart size.
No pericardial effusion.

Mediastinum/Nodes: No enlarged mediastinal or axillary lymph nodes.
Thyroid gland, trachea, and esophagus demonstrate no significant
findings.

Lungs/Pleura: No pneumothorax or pleural effusion is noted. Probable
mild subsegmental atelectasis or scarring is noted in the inferior
portions of both the lingular segment of the left lower lobe and the
right middle lobe. 2 mm nodule is noted in left lung apex best seen
on image number 18 of series 3.

Upper Abdomen: No acute abnormality.

Musculoskeletal: No chest wall mass or suspicious bone lesions
identified.
IMPRESSION: 1. Probable mild subsegmental atelectasis or scarring is noted in
the inferior portions of both the lingular segment of the left lower
lobe and the right middle lobe.
2. 2 mm nodule is noted in left lung apex. No follow-up needed if
patient is low-risk. Non-contrast chest CT can be considered in 12
months if patient is high-risk. This recommendation follows the
consensus statement: Guidelines for Management of Incidental
Pulmonary Nodules Detected on CT Images: From the [HOSPITAL]

## 2021-12-21 DIAGNOSIS — J4520 Mild intermittent asthma, uncomplicated: Secondary | ICD-10-CM | POA: Diagnosis not present

## 2022-01-03 DIAGNOSIS — E559 Vitamin D deficiency, unspecified: Secondary | ICD-10-CM | POA: Diagnosis not present

## 2022-01-03 DIAGNOSIS — Z0000 Encounter for general adult medical examination without abnormal findings: Secondary | ICD-10-CM | POA: Diagnosis not present

## 2022-01-03 DIAGNOSIS — J069 Acute upper respiratory infection, unspecified: Secondary | ICD-10-CM | POA: Diagnosis not present

## 2022-01-03 DIAGNOSIS — Z124 Encounter for screening for malignant neoplasm of cervix: Secondary | ICD-10-CM | POA: Diagnosis not present

## 2022-01-03 DIAGNOSIS — J4540 Moderate persistent asthma, uncomplicated: Secondary | ICD-10-CM | POA: Diagnosis not present

## 2022-01-03 DIAGNOSIS — Z13220 Encounter for screening for lipoid disorders: Secondary | ICD-10-CM | POA: Diagnosis not present

## 2022-01-03 DIAGNOSIS — R7303 Prediabetes: Secondary | ICD-10-CM | POA: Diagnosis not present

## 2022-01-03 DIAGNOSIS — Z1231 Encounter for screening mammogram for malignant neoplasm of breast: Secondary | ICD-10-CM | POA: Diagnosis not present

## 2022-01-03 DIAGNOSIS — I10 Essential (primary) hypertension: Secondary | ICD-10-CM | POA: Diagnosis not present

## 2022-02-03 DIAGNOSIS — J45901 Unspecified asthma with (acute) exacerbation: Secondary | ICD-10-CM | POA: Diagnosis not present

## 2022-02-03 DIAGNOSIS — J069 Acute upper respiratory infection, unspecified: Secondary | ICD-10-CM | POA: Diagnosis not present

## 2022-03-16 DIAGNOSIS — G4733 Obstructive sleep apnea (adult) (pediatric): Secondary | ICD-10-CM | POA: Diagnosis not present

## 2022-03-24 DIAGNOSIS — M25562 Pain in left knee: Secondary | ICD-10-CM | POA: Diagnosis not present

## 2022-03-24 DIAGNOSIS — M5136 Other intervertebral disc degeneration, lumbar region: Secondary | ICD-10-CM | POA: Diagnosis not present

## 2022-03-24 DIAGNOSIS — G8929 Other chronic pain: Secondary | ICD-10-CM | POA: Diagnosis not present

## 2022-05-17 DIAGNOSIS — Z13820 Encounter for screening for osteoporosis: Secondary | ICD-10-CM | POA: Diagnosis not present

## 2022-07-18 DIAGNOSIS — R7303 Prediabetes: Secondary | ICD-10-CM | POA: Diagnosis not present

## 2022-07-18 DIAGNOSIS — D509 Iron deficiency anemia, unspecified: Secondary | ICD-10-CM | POA: Diagnosis not present

## 2022-07-18 DIAGNOSIS — I10 Essential (primary) hypertension: Secondary | ICD-10-CM | POA: Diagnosis not present

## 2022-07-18 DIAGNOSIS — J4540 Moderate persistent asthma, uncomplicated: Secondary | ICD-10-CM | POA: Diagnosis not present

## 2022-11-09 DIAGNOSIS — L0390 Cellulitis, unspecified: Secondary | ICD-10-CM | POA: Diagnosis not present

## 2022-11-14 DIAGNOSIS — L03811 Cellulitis of head [any part, except face]: Secondary | ICD-10-CM | POA: Diagnosis not present

## 2022-12-27 DIAGNOSIS — D509 Iron deficiency anemia, unspecified: Secondary | ICD-10-CM | POA: Diagnosis not present

## 2022-12-27 DIAGNOSIS — J4540 Moderate persistent asthma, uncomplicated: Secondary | ICD-10-CM | POA: Diagnosis not present

## 2022-12-27 DIAGNOSIS — I10 Essential (primary) hypertension: Secondary | ICD-10-CM | POA: Diagnosis not present

## 2022-12-27 DIAGNOSIS — R7303 Prediabetes: Secondary | ICD-10-CM | POA: Diagnosis not present

## 2023-01-02 DIAGNOSIS — J069 Acute upper respiratory infection, unspecified: Secondary | ICD-10-CM | POA: Diagnosis not present

## 2023-01-09 DIAGNOSIS — D509 Iron deficiency anemia, unspecified: Secondary | ICD-10-CM | POA: Diagnosis not present

## 2023-01-09 DIAGNOSIS — Z0000 Encounter for general adult medical examination without abnormal findings: Secondary | ICD-10-CM | POA: Diagnosis not present

## 2023-01-09 DIAGNOSIS — R7303 Prediabetes: Secondary | ICD-10-CM | POA: Diagnosis not present

## 2023-01-09 DIAGNOSIS — J4540 Moderate persistent asthma, uncomplicated: Secondary | ICD-10-CM | POA: Diagnosis not present

## 2023-01-09 DIAGNOSIS — I10 Essential (primary) hypertension: Secondary | ICD-10-CM | POA: Diagnosis not present

## 2023-01-10 ENCOUNTER — Other Ambulatory Visit (HOSPITAL_BASED_OUTPATIENT_CLINIC_OR_DEPARTMENT_OTHER): Payer: Self-pay | Admitting: Family Medicine

## 2023-01-10 ENCOUNTER — Ambulatory Visit (HOSPITAL_BASED_OUTPATIENT_CLINIC_OR_DEPARTMENT_OTHER)
Admission: RE | Admit: 2023-01-10 | Discharge: 2023-01-10 | Disposition: A | Discharge status: Home / Self Care | Payer: Federal, State, Local not specified - PPO | Source: Ambulatory Visit | Attending: Family Medicine | Admitting: Family Medicine

## 2023-01-10 DIAGNOSIS — R0789 Other chest pain: Secondary | ICD-10-CM | POA: Diagnosis not present

## 2023-01-10 DIAGNOSIS — R918 Other nonspecific abnormal finding of lung field: Secondary | ICD-10-CM | POA: Diagnosis not present

## 2023-02-16 DIAGNOSIS — J45901 Unspecified asthma with (acute) exacerbation: Secondary | ICD-10-CM | POA: Diagnosis not present

## 2023-02-27 DIAGNOSIS — J40 Bronchitis, not specified as acute or chronic: Secondary | ICD-10-CM | POA: Diagnosis not present

## 2023-04-04 ENCOUNTER — Ambulatory Visit

## 2023-04-04 ENCOUNTER — Ambulatory Visit: Admitting: Podiatry

## 2023-04-04 ENCOUNTER — Encounter: Payer: Self-pay | Admitting: Podiatry

## 2023-04-04 DIAGNOSIS — Z1211 Encounter for screening for malignant neoplasm of colon: Secondary | ICD-10-CM | POA: Insufficient documentation

## 2023-04-04 DIAGNOSIS — M7750 Other enthesopathy of unspecified foot: Secondary | ICD-10-CM

## 2023-04-04 DIAGNOSIS — M2012 Hallux valgus (acquired), left foot: Secondary | ICD-10-CM | POA: Diagnosis not present

## 2023-04-04 DIAGNOSIS — D2371 Other benign neoplasm of skin of right lower limb, including hip: Secondary | ICD-10-CM

## 2023-04-04 DIAGNOSIS — R1310 Dysphagia, unspecified: Secondary | ICD-10-CM | POA: Insufficient documentation

## 2023-04-04 NOTE — Progress Notes (Signed)
 Subjective:  Patient ID: Breanna Jones, female    DOB: 07-07-1960,  MRN: [id number] HPI Chief Complaint  Patient presents with   Foot Pain    Plantar forefoot right - aching x few months, has a developing callus and gets some callusing in the bend of the big toe plantarly as well, started to noticed after wearing new shoes (declined xrays)    New Patient (Initial Visit)    Est pt 2016    63 y.o. female presents with the above complaint.   ROS: Denies fever chills nausea vomit muscle aches pains calf pain back pain chest pain shortness of breath.  Past Medical History:  Diagnosis Date   Arthritis    back   Asthma    Hypertension    Seasonal allergies    Sleep apnea    Past Surgical History:  Procedure Laterality Date   bunion removal     CESAREAN SECTION     CHOLECYSTECTOMY N/A 05/02/2019   Procedure: LAPAROSCOPIC CHOLECYSTECTOMY;  Surgeon: Harriette Bouillon, MD;  Location: [facility];  Service: General;  Laterality: N/A;    Current Outpatient Medications:    amLODipine (NORVASC) 5 MG tablet, Take 5 mg by mouth daily., Disp: , Rfl:    cetirizine (ZYRTEC) 10 MG tablet, Take 10 mg by mouth daily., Disp: , Rfl:    montelukast (SINGULAIR) 10 MG tablet, Take 10 mg by mouth daily., Disp: , Rfl:    omeprazole (PRILOSEC) 10 MG capsule, Take 20 mg by mouth daily., Disp: , Rfl:    Polyethylene Glycol 3350 (MIRALAX PO), Take by mouth., Disp: , Rfl:    Probiotic Product (PROBIOTIC DAILY PO), Take by mouth., Disp: , Rfl:    VITAMIN D PO, Take by mouth once a week., Disp: , Rfl:    Ferrous Sulfate (IRON PO), Take by mouth once a week., Disp: , Rfl:   Allergies  Allergen Reactions   Other Anaphylaxis    Fruit   Nutritional Supplements Hives and Itching    Fresh fruits   Peanuts [Peanut Oil] Swelling    Internal swelling, all nuts   Shellfish Allergy Swelling    Swelling internally   Review of Systems Objective:  There were no vitals filed for this  visit.  General: Well developed, nourished, in no acute distress, alert and oriented x3   Dermatological: Skin is warm, dry and supple bilateral. Nails x 10 are well maintained; remaining integument appears unremarkable at this time. There are no open sores, no preulcerative lesions, no rash or signs of infection present.  Mild reactive benign skin lesion plantar aspect second metatarsal bilateral.  Vascular: Dorsalis Pedis artery and Posterior Tibial artery pedal pulses are 2/4 bilateral with immedate capillary fill time. Pedal hair growth present. No varicosities and no lower extremity edema present bilateral.   Neruologic: Grossly intact via light touch bilateral. Vibratory intact via tuning fork bilateral. Protective threshold with Semmes Wienstein monofilament intact to all pedal sites bilateral. Patellar and Achilles deep tendon reflexes 2+ bilateral. No Babinski or clonus noted bilateral.   Musculoskeletal: No gross boney pedal deformities bilateral. No pain, crepitus, or limitation noted with foot and ankle range of motion bilateral. Muscular strength 5/5 in all groups tested bilateral.  Mild hallux valgus deformity left over right.  Gait: Unassisted, Nonantalgic.    Radiographs:  Patient declined  Assessment & Plan:   Assessment: Plantarflexed second metatarsal with benign skin lesion  Plan: Debridement of benign skin lesion.     Bunny Kleist T. [place], [place]

## 2023-04-13 DIAGNOSIS — Z1151 Encounter for screening for human papillomavirus (HPV): Secondary | ICD-10-CM | POA: Diagnosis not present

## 2023-04-13 DIAGNOSIS — Z01419 Encounter for gynecological examination (general) (routine) without abnormal findings: Secondary | ICD-10-CM | POA: Diagnosis not present

## 2023-04-13 DIAGNOSIS — Z6838 Body mass index (BMI) 38.0-38.9, adult: Secondary | ICD-10-CM | POA: Diagnosis not present

## 2023-04-13 DIAGNOSIS — Z124 Encounter for screening for malignant neoplasm of cervix: Secondary | ICD-10-CM | POA: Diagnosis not present

## 2023-04-13 DIAGNOSIS — Z1231 Encounter for screening mammogram for malignant neoplasm of breast: Secondary | ICD-10-CM | POA: Diagnosis not present

## 2023-04-13 LAB — RESULTS CONSOLE HPV: CHL HPV: NEGATIVE

## 2023-04-13 LAB — HM PAP SMEAR

## 2023-04-13 LAB — HM MAMMOGRAPHY

## 2023-05-22 ENCOUNTER — Other Ambulatory Visit: Payer: Self-pay

## 2023-05-22 ENCOUNTER — Emergency Department (HOSPITAL_BASED_OUTPATIENT_CLINIC_OR_DEPARTMENT_OTHER)

## 2023-05-22 ENCOUNTER — Emergency Department (HOSPITAL_BASED_OUTPATIENT_CLINIC_OR_DEPARTMENT_OTHER)
Admission: EM | Admit: 2023-05-22 | Discharge: 2023-05-22 | Disposition: A | Discharge status: Home / Self Care | Attending: Emergency Medicine | Admitting: Emergency Medicine

## 2023-05-22 ENCOUNTER — Encounter (HOSPITAL_BASED_OUTPATIENT_CLINIC_OR_DEPARTMENT_OTHER): Payer: Self-pay

## 2023-05-22 DIAGNOSIS — R070 Pain in throat: Secondary | ICD-10-CM | POA: Diagnosis not present

## 2023-05-22 DIAGNOSIS — I10 Essential (primary) hypertension: Secondary | ICD-10-CM | POA: Diagnosis not present

## 2023-05-22 DIAGNOSIS — Z91010 Allergy to peanuts: Secondary | ICD-10-CM | POA: Diagnosis not present

## 2023-05-22 DIAGNOSIS — T7411XA Adult physical abuse, confirmed, initial encounter: Secondary | ICD-10-CM | POA: Diagnosis not present

## 2023-05-22 DIAGNOSIS — Z79899 Other long term (current) drug therapy: Secondary | ICD-10-CM | POA: Diagnosis not present

## 2023-05-22 DIAGNOSIS — T7491XA Unspecified adult maltreatment, confirmed, initial encounter: Secondary | ICD-10-CM

## 2023-05-22 DIAGNOSIS — S199XXA Unspecified injury of neck, initial encounter: Secondary | ICD-10-CM | POA: Diagnosis not present

## 2023-05-22 LAB — BASIC METABOLIC PANEL WITH GFR
Anion gap: 14 (ref 5–15)
BUN: 13 mg/dL (ref 6–20)
CO2: 25 mmol/L (ref 22–32)
Calcium: 9.7 mg/dL (ref 8.9–10.3)
Chloride: 103 mmol/L (ref 98–111)
Creatinine, Ser: 0.78 mg/dL (ref 0.44–1.00)
GFR, Estimated: >60 mL/min (ref >60)
Glucose, Bld: 97 mg/dL (ref 70–99)
Potassium: 3.6 mmol/L (ref 3.5–5.1)
Sodium: 142 mmol/L (ref 135–145)

## 2023-05-22 LAB — CBC WITH DIFFERENTIAL/PLATELET
Abs Immature Granulocytes: 0.02 10*3/uL (ref 0.00–0.07)
Basophils Absolute: 0.1 10*3/uL (ref 0.0–0.1)
Basophils Relative: 1 %
Eosinophils Absolute: 0 10*3/uL (ref 0.0–0.5)
Eosinophils Relative: 0 %
HCT: 38.9 % (ref 36.0–46.0)
Hemoglobin: 12.4 g/dL (ref 12.0–15.0)
Immature Granulocytes: 0 %
Lymphocytes Relative: 29 %
Lymphs Abs: 2.8 10*3/uL (ref 0.7–4.0)
MCH: 27.5 pg (ref 26.0–34.0)
MCHC: 31.9 g/dL (ref 30.0–36.0)
MCV: 86.3 fL (ref 80.0–100.0)
Monocytes Absolute: 0.7 10*3/uL (ref 0.1–1.0)
Monocytes Relative: 7 %
Neutro Abs: 6 10*3/uL (ref 1.7–7.7)
Neutrophils Relative %: 63 %
Platelets: 421 10*3/uL — ABNORMAL HIGH (ref 150–400)
RBC: 4.51 MIL/uL (ref 3.87–5.11)
RDW: 15.3 % (ref 11.5–15.5)
WBC: 9.6 10*3/uL (ref 4.0–10.5)
nRBC: 0 % (ref 0.0–0.2)

## 2023-05-22 MED ORDER — IOHEXOL 350 MG/ML SOLN
100.0000 mL | Freq: Once | INTRAVENOUS | Status: AC | PRN
  Administered 2023-05-22: 100 mL via INTRAVENOUS
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
## 2023-05-22 NOTE — Discharge Instructions (Signed)
 As discussed, your labs and imaging are reassuring.  You can also obtain ibuprofen  600 mg and Tylenol  50 mg every 4 hours as needed for pain.  Pain make it worse before gets better.  Follow-up with your primary care provider in the next 5 days for evaluation of your symptoms.  Get help right away if: You have sudden trouble with breathing. Your swelling gets worse. You have noisy breathing. You cough up blood. You cannot swallow. You vomit. You are dizzy. You faint. You have: A drooping face. Weakness on one side of your body. Trouble speaking. Trouble understanding what people say.
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
## 2023-07-10 ENCOUNTER — Telehealth: Payer: Self-pay

## 2023-07-10 NOTE — Telephone Encounter (Signed)
 Copied from CRM [id number]. Topic: Appointments - Transfer of Care >> Jul 07, 2023  4:27 PM Mesmerise C wrote: Pt is requesting to transfer FROM: Dr. Adelfa Homes Pt is requesting to transfer TO: Dr.Thompson Reason for requested transfer: more convenient to go with husband established with same office It is the responsibility of the team the patient would like to transfer to (Dr. Hildy Lowers) to reach out to the patient if for any reason this transfer is not acceptable. >> Jul 07, 2023  4:36 PM Mesmerise C wrote: Patient requested Gavin Kast

## 2023-07-11 NOTE — Telephone Encounter (Signed)
 Patient asking to be transferred to Dr. Hildy Lowers from Dr. Adelfa Homes

## 2023-07-24 DIAGNOSIS — R519 Headache, unspecified: Secondary | ICD-10-CM | POA: Diagnosis not present

## 2023-07-24 DIAGNOSIS — R7303 Prediabetes: Secondary | ICD-10-CM | POA: Diagnosis not present

## 2023-07-24 DIAGNOSIS — I10 Essential (primary) hypertension: Secondary | ICD-10-CM | POA: Diagnosis not present

## 2023-07-24 DIAGNOSIS — D509 Iron deficiency anemia, unspecified: Secondary | ICD-10-CM | POA: Diagnosis not present

## 2023-07-24 DIAGNOSIS — J4520 Mild intermittent asthma, uncomplicated: Secondary | ICD-10-CM | POA: Diagnosis not present

## 2023-07-24 DIAGNOSIS — Z6838 Body mass index (BMI) 38.0-38.9, adult: Secondary | ICD-10-CM | POA: Diagnosis not present

## 2023-08-22 ENCOUNTER — Ambulatory Visit: Admitting: Internal Medicine

## 2023-08-22 ENCOUNTER — Encounter: Payer: Self-pay | Admitting: Internal Medicine

## 2023-08-22 VITALS — BP 132/76 | HR 73 | Temp 98.3°F | Ht 66.0 in | Wt 226.6 lb

## 2023-08-22 DIAGNOSIS — J4520 Mild intermittent asthma, uncomplicated: Secondary | ICD-10-CM

## 2023-08-22 DIAGNOSIS — G4725 Circadian rhythm sleep disorder, jet lag type: Secondary | ICD-10-CM

## 2023-08-22 DIAGNOSIS — G4733 Obstructive sleep apnea (adult) (pediatric): Secondary | ICD-10-CM | POA: Insufficient documentation

## 2023-08-22 DIAGNOSIS — K5900 Constipation, unspecified: Secondary | ICD-10-CM | POA: Insufficient documentation

## 2023-08-22 DIAGNOSIS — R7303 Prediabetes: Secondary | ICD-10-CM | POA: Diagnosis not present

## 2023-08-22 DIAGNOSIS — I10 Essential (primary) hypertension: Secondary | ICD-10-CM

## 2023-08-22 DIAGNOSIS — E559 Vitamin D deficiency, unspecified: Secondary | ICD-10-CM | POA: Diagnosis not present

## 2023-08-22 DIAGNOSIS — J309 Allergic rhinitis, unspecified: Secondary | ICD-10-CM | POA: Insufficient documentation

## 2023-08-22 DIAGNOSIS — K219 Gastro-esophageal reflux disease without esophagitis: Secondary | ICD-10-CM

## 2023-08-22 LAB — BASIC METABOLIC PANEL WITH GFR
BUN: 13 mg/dL (ref 6–23)
CO2: 31 meq/L (ref 19–32)
Calcium: 9.4 mg/dL (ref 8.4–10.5)
Chloride: 102 meq/L (ref 96–112)
Creatinine, Ser: 0.78 mg/dL (ref 0.40–1.20)
GFR: 82.37 mL/min (ref >60.00)
Glucose, Bld: 84 mg/dL (ref 70–99)
Potassium: 3.4 meq/L — ABNORMAL LOW (ref 3.5–5.1)
Sodium: 141 meq/L (ref 135–145)

## 2023-08-22 LAB — VITAMIN D 25 HYDROXY (VIT D DEFICIENCY, FRACTURES): VITD: 96.62 ng/mL (ref 30.00–100.00)

## 2023-08-22 LAB — HEMOGLOBIN A1C: Hgb A1c MFr Bld: 6.2 % (ref 4.6–6.5)

## 2023-08-22 NOTE — Progress Notes (Signed)
 [facility]IMARY CARE [facility] [address] [place] [address] Dept: [phone] Dept Fax: [phone]  New Patient Office Visit  Subjective:   Breanna Jones 1960/10/26 08/22/2023  Chief Complaint  Patient presents with   Establish Care   Fatigue    Wants to know if has jet lag   Headache    HPI: Breanna Jones presents today to establish care at [facility]. Introduced to Publishing rights manager role and practice setting.  All questions answered.  Concerns: See below   Discussed the use of AI scribe software for clinical note transcription with the patient, who gave verbal consent to proceed.  History of Present Illness   Breanna Jones is a 63 year old female who presents to establish care and to discuss recent onset of fatigue.  She is experiencing fatigue, which she attributes to recent travel and jet lag after returning from a trip to [place]  yesterday. Fatigue x 1 day. She reports headaches and digestive disturbances, including irregular bowel movements requiring the use of Miralax. She notes difficulty adjusting to the time zone changes, leading to disrupted sleep and increased tiredness.  She manages her hypertension with amlodipine 5 mg once daily. For asthma, she uses Qvar 40 mcg once daily, increasing the dose during allergy season, and albuterol as needed. She does not use albuterol nebulizer solutions as they expire before use. She has a history of prediabetes and monitors her A1c levels. She takes prescribed vitamin D  twice a week at a dose of 1.25 mg?. For sleep apnea, she uses a CPAP machine. She manages acid reflux with omeprazole 20 mg once daily. For constipation, she uses Miralax as needed and takes a daily probiotic. She also takes an iron supplement for iron deficiency anemia.  She has not had any recent changes in her medication regimen and is unsure if she needs any refills at this time.        The following portions of the patient's history were reviewed and updated as appropriate: past medical history, past surgical history, family history, social history, allergies, medications, and problem list.   Patient Active Problem List   Diagnosis Date Noted   Obstructive sleep apnea syndrome 08/22/2023   Dysphagia 04/04/2023   Morbid obesity (HCC) 04/04/2023   Sensorineural hearing loss (SNHL), bilateral 04/10/2019   Tinnitus of both ears 04/10/2019   Essential hypertension 03/21/2018   Prediabetes 03/21/2018   Vitamin D  deficiency 03/21/2018   Chronic midline low back pain without sciatica 11/15/2016   Gastroesophageal reflux disease 11/15/2016   Mild intermittent asthma without complication 11/15/2016   Moderate asthma with acute exacerbation 11/15/2016   Past Medical History:  Diagnosis Date   Arthritis    back   Asthma    Hypertension    Seasonal allergies    Sleep apnea    Past Surgical History:  Procedure Laterality Date   bunion removal     CESAREAN SECTION     CHOLECYSTECTOMY N/A 05/02/2019   Procedure: LAPAROSCOPIC CHOLECYSTECTOMY;  Surgeon: Vanderbilt Ned, MD;  Location: [facility];  Service: General;  Laterality: N/A;   No family history on file.  Current Outpatient Medications:    amLODipine (NORVASC) 5 MG tablet, Take 5 mg by mouth daily., Disp: , Rfl:    cetirizine (ZYRTEC) 10 MG tablet, Take 10 mg by mouth daily., Disp: , Rfl:    Ferrous Sulfate (IRON PO), Take by mouth once a week., Disp: , Rfl:    montelukast (SINGULAIR)  10 MG tablet, Take 10 mg by mouth daily., Disp: , Rfl:    omeprazole (PRILOSEC) 10 MG capsule, Take 20 mg by mouth daily., Disp: , Rfl:    Polyethylene Glycol 3350 (MIRALAX PO), Take by mouth., Disp: , Rfl:    Probiotic Product (PROBIOTIC DAILY PO), Take by mouth., Disp: , Rfl:    QVAR REDIHALER 40 MCG/ACT inhaler, Inhale 1 puff into the lungs daily., Disp: , Rfl:    albuterol (VENTOLIN HFA) 108 (90 Base) MCG/ACT  inhaler, 2 puff as needed Inhalation every 6 hrs; Duration: 90 days, Disp: , Rfl:    Vitamin D , Ergocalciferol , (DRISDOL) 1.25 MG (50000 UNIT) CAPS capsule, Take 50,000 Units by mouth every 7 (seven) days., Disp: , Rfl:  Allergies  Allergen Reactions   Other Anaphylaxis    Fruit   Bacitracin-Polymyxin B     Other Reaction(s): made ear infection worse   Brassica Oleracea     Other Reaction(s): felt like pins were sticking into her body   Ibuprofen      Other Reaction(s): Swelling of the throat, Swelling of throat   Lisinopril     Other Reaction(s): tingling in lips and tongue   Nutritional Supplements Hives and Itching    Fresh fruits   Peanuts [Peanut Oil] Swelling    Internal swelling, all nuts   Shellfish Allergy Swelling    Swelling internally    ROS: A complete ROS was performed with pertinent positives/negatives noted in the HPI. The remainder of the ROS are negative.   Objective:   Today's Vitals   08/22/23 1309  BP: 132/76  Pulse: 73  Temp: 98.3 F (36.8 C)  TempSrc: Temporal  SpO2: 98%  Weight: 226 lb 9.6 oz (102.8 kg)  Height: 5' 6 (1.676 m)    GENERAL: Well-appearing, in NAD. Well nourished.  SKIN: Pink, warm and dry. No rash, lesion, ulceration, or ecchymoses.  NECK: Trachea midline. Full ROM w/o pain or tenderness. No lymphadenopathy.  RESPIRATORY: Chest wall symmetrical. Respirations even and non-labored. Breath sounds clear to auscultation bilaterally.  CARDIAC: S1, S2 present, regular rate and rhythm. Peripheral pulses 2+ bilaterally.  EXTREMITIES: Without clubbing, cyanosis, or edema.  NEUROLOGIC: No motor or sensory deficits. Steady, even gait.  PSYCH/MENTAL STATUS: Alert, oriented x 3. Cooperative, appropriate mood and affect.   Health Maintenance Due  Topic Date Due   HIV Screening  Never done   Hepatitis C Screening  Never done    No results found for any visits on 08/22/23.  Assessment & Plan:  Assessment and Plan    Fatigue likely due  to jet lag Fatigue attributed to recent travel and time zone changes. - Advise hydration and maintaining a regular routine. - Encourage gradual adjustment of sleep schedule.  Essential hypertension Blood pressure within normal limits, managed with amlodipine 5 mg once daily. - Check BMP  Asthma Asthma managed with Qvar 40 mcg once daily and albuterol inhaler as needed.  Prediabetes Prediabetes with monitoring of A1c levels. - Order A1c test to monitor glucose control.  Vitamin D  deficiency Vitamin D  deficiency managed with vitamin D  1.25 mg twice weekly?. - Order vitamin D  level test to ensure stability.  Obstructive sleep apnea Obstructive sleep apnea managed with CPAP machine.  Gastroesophageal reflux disease (GERD) GERD managed with omeprazole 20 mg once daily.  Constipation Constipation managed with Miralax as needed and daily probiotic.  Iron deficiency anemia Iron deficiency anemia managed with iron supplement.  General Health Maintenance Up to date on colonoscopy, tetanus shot, and  mammogram. Pap smear records to be updated. - Request and update mammogram and Pap smear records.    Orders Placed This Encounter  Procedures   Basic Metabolic Panel (BMET)   Hemoglobin A1C   VITAMIN D  25 Hydroxy (Vit-D Deficiency, Fractures)   No orders of the defined types were placed in this encounter.   Return in about 4 months (around 12/23/2023) for Chronic Condition follow up.   Rosina Senters, FNP

## 2023-08-23 ENCOUNTER — Encounter: Payer: Self-pay | Admitting: Internal Medicine

## 2023-08-28 ENCOUNTER — Ambulatory Visit: Payer: Self-pay | Admitting: Internal Medicine

## 2023-12-25 ENCOUNTER — Ambulatory Visit: Admitting: Internal Medicine

## 2023-12-25 ENCOUNTER — Ambulatory Visit: Payer: Self-pay

## 2023-12-25 ENCOUNTER — Ambulatory Visit: Admitting: Emergency Medicine

## 2023-12-25 ENCOUNTER — Encounter: Payer: Self-pay | Admitting: Emergency Medicine

## 2023-12-25 VITALS — BP 116/78 | HR 93 | Temp 98.7°F | Resp 16 | Ht 66.0 in | Wt 232.0 lb

## 2023-12-25 DIAGNOSIS — J069 Acute upper respiratory infection, unspecified: Secondary | ICD-10-CM

## 2023-12-25 DIAGNOSIS — J4541 Moderate persistent asthma with (acute) exacerbation: Secondary | ICD-10-CM

## 2023-12-25 DIAGNOSIS — R051 Acute cough: Secondary | ICD-10-CM | POA: Diagnosis not present

## 2023-12-25 LAB — POC COVID19 BINAXNOW: SARS Coronavirus 2 Ag: NEGATIVE

## 2023-12-25 MED ORDER — PREDNISONE 20 MG PO TABS: ORAL_TABLET | ORAL | 0 refills | Status: AC

## 2023-12-25 NOTE — Progress Notes (Signed)
 Assessment & Plan:  1. Acute cough (Primary)  - POC COVID-19 BinaxNow  2. Moderate persistent asthma with acute exacerbation  - predniSONE (DELTASONE) 20 MG tablet; Take 3 tablets (60 mg total) by mouth daily for 3 days, THEN 2 tablets (40 mg total) daily for 3 days, THEN 1 tablet (20 mg total) daily for 3 days.  Dispense: 18 tablet; Refill: 0  3. Acute URI Supportive care reviewed     Results for orders placed or performed in visit on 12/25/23  POC COVID-19 BinaxNow  Result Value Ref Range   SARS Coronavirus 2 Ag Negative Negative    Patient Instructions  WHAT YOU SHOULD KNOW:   You have been diagnosed with a URI, an infection that can affect your nose, throat, ears, and sinuses, causing your upper respiratory system to become inflamed. Common symptoms include sneezing, dry throat, a stuffy nose, headache, watery eyes, and a cough. Your cough may be dry, or you may cough up mucus. You may also have muscle aches, joint pain, and tiredness. Rarely, you may have a fever. Your cough may be dry, or you may cough up mucus.     The average adult experiences 2-3 URIs per year and for healthy people, the illness is not usually serious and does not need special treatment. The symptoms are usually worst for the first 3 to 5 days, and most illnesses last 3-7 days, although many people continue to have symptoms (coughing, sneezing, congestion) for up to two weeks. You may continue to cough for up to 3 weeks.     Some viruses that cause URIs can also depress the immune system or cause swelling in the lining of the nose or airways; this can, in turn, lead to a new viral infection or bacterial infection.  One of the more common complications is sinusitis, which is usually caused by viruses and rarely (about 2 percent of the time) by bacteria. However, it can be difficult to distinguish bacterial sinusitis from sinusitis caused by a cold because the signs and symptoms can be similar. Having thick or  yellow to green-colored nasal discharge does not mean that bacterial sinusitis has developed; discolored nasal discharge is a normal phase of the common cold   Lower respiratory infections, such as pneumonia or bronchitis, may develop following a cold.  Infection of the middle ear, or otitis media, can accompany or follow a cold.    AFTER YOU LEAVE:   Medicines:  Prednisone for calming lungs Albuterol as needed Continue QVAR Can use coricidin products (generic is fine)- these are for cold symptoms but are safe for patients with prior blood pressure issues.   Self-care:   Rest: Rest until your fever is gone and you feel better.   Stay hydrated:   Liquids will help thin and loosen thick mucus so you can cough it up. Drink 8 to 10 cups of liquids such as water, ginger ale, tea or fruit juices each day. If you are vomiting, good fluids include decaffeinated sports drinks.   Gargle: Gargle with warm salt water to help your sore throat feel better. Make salt water by adding  teaspoon salt to 1 cup warm water. You may also suck on hard candy or throat lozenges. Benzocaine lozenges are numbing. Pectin (like in Luden[employer]) are soothing. Menthol drops (Halls and Ricola) are helpful if you have nasal congestion or frequent urge to cough.  Saline nasal drops: These can help relieve your congestion. They can be bought without a prescription. Saline  rinses can help thin and clear mucous, NeilMed is my preferred brand  Take a warm bath or shower: This may help decrease body aches and help you breathe easier.   Use a humidifier: This will increase air moisture and make it easier for you to breathe. Cool mist humidifier should be used around small children for safety; otherwise choose cool or warm mist as you prefer.     Prevent spreading your cold to others:   Try to stay away from other people during the first 2 to 3 days of your cold when it is more easily spread.  Do not share food or drinks with anyone.   Do  not share hand towels with household members.  Wash your hands often, especially after you blow your nose. Turn away from other people and cover your mouth and nose with a tissue when you sneeze or cough.    Contact your primary healthcare provider if:   Your symptoms get worse after 3 to 5 days or your cold is not better in 10 days.  You have a new rash anywhere on your skin.  You have large and tender lumps in your neck.  You have thick, green or yellow drainage from your nose for more than a few days.  You cough up thick yellow, green mucus for more than a few days or if you have any gray or bloody mucus.  You have vomiting for more than 24 hours and cannot keep fluids down.  You have a bad earache.  You have questions about your condition or care.    Go to the emergency department if:   You have a sudden/SEVERE headache or a new stiff neck You have chest pain or trouble breathing.  You cannot hold down liquids and are concerned you are dehydrated You are struggling to stay awake    Breanna Jones, MSPAS, PA-C   Subjective:  HPI: Breanna Jones is a 63 y.o. female with history of  has a past medical history of Arthritis, Asthma, Hypertension, Seasonal allergies, and Sleep apnea. presenting on 12/25/2023 for Cough (Inconsistent dry cough, chills and congestion x 3 days. No fever. ) Hx moderate persistent asthma  Discussed the use of AI scribe software for clinical note transcription with the patient, who gave verbal consent to proceed.  History of Present Illness Breanna Jones is a 63 year old female with a history of moderate persistent asthma who presents with respiratory symptoms. Her respiratory symptoms began a few days ago with a mostly dry cough and shortness of breath, especially when walking from the parking lot to the front door. She notes similar past illnesses have sometimes progressed to pneumonia if not treated early. She has not had fever. Assoc sx include watery left  eye, rhinorrhea. Occ yellow mucous.  She has not used her albuterol inhaler in two days because she feels her wheezing is not bothersome and continues to take Qvar daily. She denies recent sick contacts and is keeping her daughter away as a precaution.    ROS: Negative unless specifically indicated above in HPI.   Relevant past medical history reviewed and updated as indicated.   Allergies and medications reviewed and updated.   Current Outpatient Medications:    albuterol (VENTOLIN HFA) 108 (90 Base) MCG/ACT inhaler, 2 puff as needed Inhalation every 6 hrs; Duration: 90 days, Disp: , Rfl:    amLODipine (NORVASC) 5 MG tablet, Take 5 mg by mouth daily., Disp: , Rfl:    cetirizine (ZYRTEC)  10 MG tablet, Take 10 mg by mouth daily., Disp: , Rfl:    Ferrous Sulfate (IRON PO), Take by mouth once a week., Disp: , Rfl:    montelukast (SINGULAIR) 10 MG tablet, Take 10 mg by mouth daily., Disp: , Rfl:    Polyethylene Glycol 3350 (MIRALAX PO), Take by mouth., Disp: , Rfl:    predniSONE (DELTASONE) 20 MG tablet, Take 3 tablets (60 mg total) by mouth daily for 3 days, THEN 2 tablets (40 mg total) daily for 3 days, THEN 1 tablet (20 mg total) daily for 3 days., Disp: 18 tablet, Rfl: 0   Probiotic Product (PROBIOTIC DAILY PO), Take by mouth., Disp: , Rfl:    QVAR REDIHALER 40 MCG/ACT inhaler, Inhale 1 puff into the lungs daily., Disp: , Rfl:    Vitamin D , Ergocalciferol , (DRISDOL) 1.25 MG (50000 UNIT) CAPS capsule, Take 50,000 Units by mouth every 7 (seven) days., Disp: , Rfl:    omeprazole (PRILOSEC) 10 MG capsule, Take 20 mg by mouth daily. (Patient not taking: Reported on 12/25/2023), Disp: , Rfl:   Allergies  Allergen Reactions   Ibuprofen  Anaphylaxis   Lisinopril Swelling   Other Anaphylaxis    Fruit   Peanuts [Peanut Oil] Anaphylaxis   Shellfish Allergy Swelling   Brassica Oleracea Other (See Comments)    Other Reaction(s): felt like pins were sticking into her body   Nutritional  Supplements Hives and Itching    Fresh fruits- can only eat apples, oranges, grapes   Bacitracin-Polymyxin B Swelling      Objective:   Vitals:   12/25/23 1258  BP: 116/78  Pulse: 93  Temp: 98.7 F (37.1 C)  Resp: 16  Height: 5' 6 (1.676 m)  Weight: 232 lb (105.2 kg)  SpO2: 95%  BMI (Calculated): 37.46      Gen: appears fatigued, alert, INAD Ears: Bilateral canals clear. TM dull with injection over bony landmarks no effusion.   Eyes conjunctiva clear bilaterally.  Nose: mucosal congestion Mouth: Oral mucosa moist. Throat: cobblestone OP no ulcers or exudate.   Neck: supple and no adenopathy Heart RRR Lungs: Respiratory effort: normal.  Faint end exp wheeze bilateral upper fields, otherwise clear with no increased WOB noted on conversation.  Skin: Warm and dry without acute rash to exposed areas.

## 2023-12-25 NOTE — Telephone Encounter (Signed)
 FYI Only or Action Required?: FYI only for provider: appointment scheduled on 12/25/2023 at 1 PM.  Patient was last seen in primary care on 08/22/2023 by Billy Knee, FNP.  Called Nurse Triage reporting Shortness of Breath.  Symptoms began several days ago.  Interventions attempted: Rest, hydration, or home remedies.  Symptoms are: unchanged.  Triage Disposition: See HCP Within 4 Hours (Or PCP Triage)  Patient/caregiver understands and will follow disposition?: Yes  Copied from CRM #[id number]. Topic: Clinical - Red Word Triage >> Dec 25, 2023  9:05 AM Knee BIRCH wrote: Red Word that prompted transfer to Nurse Triage: patient has asthma but she is not wheezing but there is something heavy on her chest, her left eye is tearing up and she has the sniffles Reason for Disposition  [1] Longstanding difficulty breathing (e.g., CHF, COPD, emphysema) AND [2] WORSE than normal  Answer Assessment - Initial Assessment Questions Patient with history of asthma called with shortness of breath, dry cough and chest heaviness. Patient reports her left eye is tearing up and she has a runny nose.   1. RESPIRATORY STATUS: Describe your breathing? (e.g., wheezing, shortness of breath, unable to speak, severe coughing)      Shortness of breath, dry cough (moderate in nature) 2. ONSET: When did this breathing problem begin?      Started Friday.  3. PATTERN Does the difficult breathing come and go, or has it been constant since it started?      Only with movement 4. SEVERITY: How bad is your breathing? (e.g., mild, moderate, severe)      moderate 5. RECURRENT SYMPTOM: Have you had difficulty breathing before? If Yes, ask: When was the last time? and What happened that time?      Yes-earlier this year 6. CARDIAC HISTORY: Do you have any history of heart disease? (e.g., heart attack, angina, bypass surgery, angioplasty)      no 7. LUNG HISTORY: Do you have any history of lung disease?  (e.g.,  pulmonary embolus, asthma, emphysema)     yes 8. CAUSE: What do you think is causing the breathing problem?      Patient is concerned for possible bad cold 9. OTHER SYMPTOMS: Do you have any other symptoms? (e.g., chest pain, cough, dizziness, fever, runny nose)     Runny nose, cough 10. O2 SATURATION MONITOR:  Do you use an oxygen saturation monitor (pulse oximeter) at home? If Yes, ask: What is your reading (oxygen level) today? What is your usual oxygen saturation reading? (e.g., 95%)       NA 12. TRAVEL: Have you traveled out of the country in the last month? (e.g., travel history, exposures)       no  Protocols used: Breathing Difficulty-A-AH

## 2023-12-25 NOTE — Patient Instructions (Addendum)
 WHAT YOU SHOULD KNOW:   You have been diagnosed with a URI, an infection that can affect your nose, throat, ears, and sinuses, causing your upper respiratory system to become inflamed. Common symptoms include sneezing, dry throat, a stuffy nose, headache, watery eyes, and a cough. Your cough may be dry, or you may cough up mucus. You may also have muscle aches, joint pain, and tiredness. Rarely, you may have a fever. Your cough may be dry, or you may cough up mucus.     The average adult experiences 2-3 URIs per year and for healthy people, the illness is not usually serious and does not need special treatment. The symptoms are usually worst for the first 3 to 5 days, and most illnesses last 3-7 days, although many people continue to have symptoms (coughing, sneezing, congestion) for up to two weeks. You may continue to cough for up to 3 weeks.     Some viruses that cause URIs can also depress the immune system or cause swelling in the lining of the nose or airways; this can, in turn, lead to a new viral infection or bacterial infection.  One of the more common complications is sinusitis, which is usually caused by viruses and rarely (about 2 percent of the time) by bacteria. However, it can be difficult to distinguish bacterial sinusitis from sinusitis caused by a cold because the signs and symptoms can be similar. Having thick or yellow to green-colored nasal discharge does not mean that bacterial sinusitis has developed; discolored nasal discharge is a normal phase of the common cold   Lower respiratory infections, such as pneumonia or bronchitis, may develop following a cold.  Infection of the middle ear, or otitis media, can accompany or follow a cold.    AFTER YOU LEAVE:   Medicines:  Prednisone for calming lungs Albuterol as needed Continue QVAR Can use coricidin products (generic is fine)- these are for cold symptoms but are safe for patients with prior blood pressure issues.   Self-care:    Rest: Rest until your fever is gone and you feel better.   Stay hydrated:   Liquids will help thin and loosen thick mucus so you can cough it up. Drink 8 to 10 cups of liquids such as water, ginger ale, tea or fruit juices each day. If you are vomiting, good fluids include decaffeinated sports drinks.   Gargle: Gargle with warm salt water to help your sore throat feel better. Make salt water by adding  teaspoon salt to 1 cup warm water. You may also suck on hard candy or throat lozenges. Benzocaine lozenges are numbing. Pectin (like in [employer]) are soothing. Menthol drops (Halls and Ricola) are helpful if you have nasal congestion or frequent urge to cough.  Saline nasal drops: These can help relieve your congestion. They can be bought without a prescription. Saline rinses can help thin and clear mucous, NeilMed is my preferred brand  Take a warm bath or shower: This may help decrease body aches and help you breathe easier.   Use a humidifier: This will increase air moisture and make it easier for you to breathe. Cool mist humidifier should be used around small children for safety; otherwise choose cool or warm mist as you prefer.     Prevent spreading your cold to others:   Try to stay away from other people during the first 2 to 3 days of your cold when it is more easily spread.  Do not share food or drinks with  anyone.   Do not share hand towels with household members.  Wash your hands often, especially after you blow your nose. Turn away from other people and cover your mouth and nose with a tissue when you sneeze or cough.    Contact your primary healthcare provider if:   Your symptoms get worse after 3 to 5 days or your cold is not better in 10 days.  You have a new rash anywhere on your skin.  You have large and tender lumps in your neck.  You have thick, green or yellow drainage from your nose for more than a few days.  You cough up thick yellow, green mucus for more than a few days or  if you have any gray or bloody mucus.  You have vomiting for more than 24 hours and cannot keep fluids down.  You have a bad earache.  You have questions about your condition or care.    Go to the emergency department if:   You have a sudden/SEVERE headache or a new stiff neck You have chest pain or trouble breathing.  You cannot hold down liquids and are concerned you are dehydrated You are struggling to stay awake

## 2024-01-12 ENCOUNTER — Encounter: Payer: Self-pay | Admitting: Internal Medicine

## 2024-01-12 ENCOUNTER — Ambulatory Visit: Admitting: Internal Medicine

## 2024-01-12 VITALS — BP 120/80 | HR 73 | Temp 98.2°F | Ht 63.0 in | Wt 231.4 lb

## 2024-01-12 DIAGNOSIS — M1712 Unilateral primary osteoarthritis, left knee: Secondary | ICD-10-CM

## 2024-01-12 DIAGNOSIS — K5900 Constipation, unspecified: Secondary | ICD-10-CM

## 2024-01-12 DIAGNOSIS — Z23 Encounter for immunization: Secondary | ICD-10-CM | POA: Diagnosis not present

## 2024-01-12 DIAGNOSIS — M79672 Pain in left foot: Secondary | ICD-10-CM

## 2024-01-12 DIAGNOSIS — Z0000 Encounter for general adult medical examination without abnormal findings: Secondary | ICD-10-CM

## 2024-01-12 LAB — CBC WITH DIFFERENTIAL/PLATELET
Basophils Absolute: 0.1 K/uL (ref 0.0–0.1)
Basophils Relative: 0.7 % (ref 0.0–3.0)
Eosinophils Absolute: 0.1 K/uL (ref 0.0–0.7)
Eosinophils Relative: 1 % (ref 0.0–5.0)
HCT: 37.1 % (ref 36.0–46.0)
Hemoglobin: 12 g/dL (ref 12.0–15.0)
Lymphocytes Relative: 27.9 % (ref 12.0–46.0)
Lymphs Abs: 2.7 K/uL (ref 0.7–4.0)
MCHC: 32.4 g/dL (ref 30.0–36.0)
MCV: 84.8 fl (ref 78.0–100.0)
Monocytes Absolute: 0.8 K/uL (ref 0.1–1.0)
Monocytes Relative: 8.1 % (ref 3.0–12.0)
Neutro Abs: 5.9 K/uL (ref 1.4–7.7)
Neutrophils Relative %: 62.3 % (ref 43.0–77.0)
Platelets: 383 K/uL (ref 150.0–400.0)
RBC: 4.38 Mil/uL (ref 3.87–5.11)
RDW: 16 % — ABNORMAL HIGH (ref 11.5–15.5)
WBC: 9.5 K/uL (ref 4.0–10.5)

## 2024-01-12 LAB — COMPREHENSIVE METABOLIC PANEL WITH GFR
ALT: 28 U/L (ref 3–35)
AST: 17 U/L (ref 5–37)
Albumin: 4.1 g/dL (ref 3.5–5.2)
Alkaline Phosphatase: 79 U/L (ref 39–117)
BUN: 8 mg/dL (ref 6–23)
CO2: 33 meq/L — ABNORMAL HIGH (ref 19–32)
Calcium: 9.2 mg/dL (ref 8.4–10.5)
Chloride: 103 meq/L (ref 96–112)
Creatinine, Ser: 0.69 mg/dL (ref 0.40–1.20)
GFR: 93.86 mL/min
Glucose, Bld: 87 mg/dL (ref 70–99)
Potassium: 3.5 meq/L (ref 3.5–5.1)
Sodium: 142 meq/L (ref 135–145)
Total Bilirubin: 0.5 mg/dL (ref 0.2–1.2)
Total Protein: 7.3 g/dL (ref 6.0–8.3)

## 2024-01-12 LAB — LIPID PANEL
Cholesterol: 135 mg/dL (ref 28–200)
HDL: 46.8 mg/dL
LDL Cholesterol: 79 mg/dL (ref 10–99)
NonHDL: 88.16
Total CHOL/HDL Ratio: 3
Triglycerides: 45 mg/dL (ref 10.0–149.0)
VLDL: 9 mg/dL (ref 0.0–40.0)

## 2024-01-12 LAB — HEMOGLOBIN A1C: Hgb A1c MFr Bld: 6.2 % (ref 4.6–6.5)

## 2024-01-12 LAB — TSH: TSH: 1.47 u[IU]/mL (ref 0.35–5.50)

## 2024-01-12 NOTE — Progress Notes (Unsigned)
 "  Subjective:   Breanna Jones 10-29-1960  01/12/2024   CC: Chief Complaint  Patient presents with   Follow-up    BP no concerns, pt is fasting     HPI: Breanna Jones is a 63 y.o. female who presents for a routine health maintenance exam.  Labs  collected at time of visit.    HEALTH SCREENINGS: - Pap smear: up to date - Mammogram (40+): Up to date  - Colonoscopy (45+): Up to date  - Bone Density (65+): Up to date  - Lung CA screening with low-dose CT:  Not applicable Adults age 40-80 who are current cigarette smokers or quit within the last 15 years. Must have 20 pack year history.   IMMUNIZATIONS: - Tdap: Tetanus vaccination status reviewed: last tetanus booster within 10 years. - HPV: Not applicable - Influenza: Refused - Prevnar 20: Administered today - Zostavax (50+): discussed - will defer to later visit    Past medical history, surgical history, medications, allergies, family history and social history reviewed with patient today and changes made to appropriate areas of the chart.   Social History   Socioeconomic History   Marital status: Married    Spouse name: Not on file   Number of children: Not on file   Years of education: Not on file   Highest education level: Not on file  Occupational History   Not on file  Tobacco Use   Smoking status: Never   Smokeless tobacco: Never  Vaping Use   Vaping status: Never Used  Substance and Sexual Activity   Alcohol use: Yes    Alcohol/week: 0.0 standard drinks of alcohol    Comment: social   Drug use: No   Sexual activity: Not on file  Other Topics Concern   Not on file  Social History Narrative   Not on file   Social Drivers of Health   Tobacco Use: Low Risk (01/12/2024)   Patient History    Smoking Tobacco Use: Never    Smokeless Tobacco Use: Never    Passive Exposure: Not on file  Financial Resource Strain: Not on file  Food Insecurity: Not on file  Transportation Needs: Not on file  Physical  Activity: Not on file  Stress: Not on file  Social Connections: Unknown (06/07/2021)   Received from [facility]   Social Network    Social Network: Not on file  Intimate Partner Violence: Unknown (04/29/2021)   Received from [facility]   HITS    Physically Hurt: Not on file    Insult or Talk Down To: Not on file    Threaten Physical Harm: Not on file    Scream or Curse: Not on file  Depression (PHQ2-9): Medium Risk (08/22/2023)   Depression (PHQ2-9)    PHQ-2 Score: 9  Alcohol Screen: Not on file  Housing: Not on file  Utilities: Not on file  Health Literacy: Not on file     Past Medical History:  Diagnosis Date   Arthritis    back   Asthma    Hypertension    Seasonal allergies    Sleep apnea     Past Surgical History:  Procedure Laterality Date   bunion removal     CESAREAN SECTION     CHOLECYSTECTOMY N/A 05/02/2019   Procedure: LAPAROSCOPIC CHOLECYSTECTOMY;  Surgeon: Vanderbilt Ned, MD;  Location: [facility];  Service: General;  Laterality: N/A;    Current Outpatient Medications on File Prior to Visit  Medication Sig  albuterol (VENTOLIN HFA) 108 (90 Base) MCG/ACT inhaler 2 puff as needed Inhalation every 6 hrs; Duration: 90 days   amLODipine (NORVASC) 5 MG tablet Take 5 mg by mouth daily.   cetirizine (ZYRTEC) 10 MG tablet Take 10 mg by mouth daily.   Ferrous Sulfate (IRON PO) Take by mouth once a week.   montelukast (SINGULAIR) 10 MG tablet Take 10 mg by mouth daily.   Polyethylene Glycol 3350 (MIRALAX PO) Take by mouth.   Probiotic Product (PROBIOTIC DAILY PO) Take by mouth.   QVAR REDIHALER 40 MCG/ACT inhaler Inhale 1 puff into the lungs daily.   Vitamin D , Ergocalciferol , (DRISDOL) 1.25 MG (50000 UNIT) CAPS capsule Take 50,000 Units by mouth every 7 (seven) days.   omeprazole (PRILOSEC) 10 MG capsule Take 20 mg by mouth daily. (Patient not taking: Reported on 12/25/2023)   No current facility-administered medications on file prior to visit.     Allergies[1]  History reviewed. No pertinent family history.   ROS: Denies fever, fatigue, unexplained weight loss/gain, hearing or vision changes, cardiac or respiratory complaints. Denies neurological deficits, musculoskeletal complaints, gastrointestinal or genitourinary complaints, mental health complaints, and skin changes.   Objective:   Today's Vitals   01/12/24 0904  BP: 120/80  Pulse: 73  Temp: 98.2 F (36.8 C)  TempSrc: Temporal  SpO2: 99%  Weight: 231 lb 6.4 oz (105 kg)  Height: 5' 3 (1.6 m)    GENERAL APPEARANCE: Well-appearing, in NAD. Well nourished.  SKIN: Pink, warm and dry. Turgor normal. No rash, lesion, ulceration, or ecchymoses. Hair evenly distributed.  HEENT: HEAD: Normocephalic.  EYES: PERRLA. EOMI. Lids intact w/o defect. Sclera white, Conjunctiva pink w/o exudate.  EARS: External ear w/o redness, swelling, masses or lesions. EAC clear. TM's intact, translucent w/o bulging, appropriate landmarks visualized. Appropriate acuity to conversational tones.  NOSE: Septum midline w/o deformity. Nares patent, mucosa pink and non-inflamed w/o drainage.  THROAT: Uvula midline. Oropharynx clear. Tonsils non-inflamed w/o exudate ***. Oral mucosa pink and moist.  NECK: Supple, Trachea midline. Full ROM w/o pain or tenderness. No lymphadenopathy. Thyroid non-tender w/o enlargement or palpable masses.  BREASTS: Breasts pendulous, symmetrical, and w/o palpable masses. Nipples everted and w/o discharge. No rash or skin retraction. No axillary or supraclavicular lymphadenopathy.  RESPIRATORY: Chest wall symmetrical w/o masses. Respirations even and non-labored. Breath sounds clear to auscultation bilaterally. No wheezes, rales, rhonchi, or crackles. CARDIAC: S1, S2 present, regular rate and rhythm. No gallops, murmurs, rubs, or clicks. *** carotid bruits. Capillary refill <2 seconds. Peripheral pulses 2+ bilaterally. GI: Abdomen soft w/o distention. Normoactive bowel  sounds. No palpable masses or tenderness. No guarding or rebound tenderness. Liver and spleen w/o tenderness or enlargement. No CVA tenderness.  GU: *** External genitalia without erythema, lesions, or masses. No lymphadenopathy. Vaginal mucosa pink and moist without exudate, lesions, or ulcerations. Cervix pink without discharge. Cervical os closed. Uterus and adnexae palpable, not enlarged, and w/o tenderness. No palpable masses.  MSK: Muscle tone and strength appropriate for age, w/o atrophy or abnormal movement.  EXTREMITIES: Active ROM intact, w/o tenderness, crepitus, or contracture. No obvious joint deformities or effusions. No clubbing, edema, or cyanosis.  NEUROLOGIC: CN's II-XII intact. Motor strength symmetrical with no obvious weakness. No sensory deficits. Steady, even gait.  PSYCH/MENTAL STATUS: Alert, oriented x 3. Cooperative, appropriate mood and affect.   Chaperoned by Avelina Finder CMA ***  Depression and Anxiety Screen done today and results listed below:     08/22/2023    1:13 PM  Depression  screen PHQ 2/9  Decreased Interest 1  Down, Depressed, Hopeless 0  PHQ - 2 Score 1  Altered sleeping 2  Tired, decreased energy 2  Change in appetite 2  Feeling bad or failure about yourself  0  Trouble concentrating 2  Moving slowly or fidgety/restless 0  Suicidal thoughts 0  PHQ-9 Score 9   Difficult doing work/chores Not difficult at all     Data saved with a previous flowsheet row definition      08/22/2023    1:14 PM  GAD 7 : Generalized Anxiety Score  Nervous, Anxious, on Edge 0  Control/stop worrying 0  Worry too much - different things 0  Trouble relaxing 0  Restless 0  Easily annoyed or irritable 0  Afraid - awful might happen 1  Total GAD 7 Score 1  Anxiety Difficulty Not difficult at all     Results for orders placed or performed in visit on 12/25/23  POC COVID-19 [employer]   Collection Time: 12/25/23  1:40 PM  Result Value Ref Range   SARS  Coronavirus 2 Ag Negative Negative    Assessment & Plan:  Essential hypertension  Moderate persistent asthma with acute exacerbation  Vitamin D  deficiency  Prediabetes    No orders of the defined types were placed in this encounter.   PATIENT COUNSELING:  - Encouraged a healthy well-balanced diet. Patient may adjust caloric intake to maintain or achieve ideal body weight. May reduce intake of dietary saturated fat and total fat and have adequate dietary potassium and calcium preferably from fresh fruits, vegetables, and low-fat dairy products.   - Advised to avoid cigarette smoking. - Discussed with the patient that most people either abstain from alcohol or drink within safe limits (<=14/week and <=4 drinks/occasion for males, <=7/weeks and <= 3 drinks/occasion for females) and that the risk for alcohol disorders and other health effects rises proportionally with the number of drinks per week and how often a drinker exceeds daily limits. - Discussed cessation/primary prevention of drug use and availability of treatment for abuse.  - Discussed sexually transmitted diseases, avoidance of unintended pregnancy and contraceptive alternatives.  - Stressed the importance of regular exercise - Injury prevention: Discussed safety belts, safety helmets, smoke detector, smoking near bedding or upholstery.  - Dental health: Discussed importance of regular tooth brushing, flossing, and dental visits.   NEXT PREVENTATIVE PHYSICAL DUE IN 1 YEAR.  No follow-ups on file.  Rosina Senters, FNP     [1]  Allergies Allergen Reactions   Ibuprofen  Anaphylaxis   Lisinopril Swelling   Other Anaphylaxis    Fruit   Peanuts [Peanut Oil] Anaphylaxis   Shellfish Allergy Swelling   Brassica Oleracea Other (See Comments)    Other Reaction(s): felt like pins were sticking into her body   Nutritional Supplements Hives and Itching    Fresh fruits- can only eat apples, oranges, grapes    Bacitracin-Polymyxin B Swelling   "

## 2024-01-12 NOTE — Patient Instructions (Signed)
 CONSTIPATION: Dietary changes (more leafy greens, vegetables and fruits; less processed foods) Decrease intake of foods that may cause constipation such as dairy (milk, cheeses), processed meats, and white bread.  Drink at least 6-8 cups of water per day Ensure regular exercise, this includes walking 30 minutes a day    Fiber supplementation (Benefiber, FiberCon, Metamucil, or Psyllium). Start slow and increase gradually to full dose.  Add a probiotic (such as Florastor) daily.  A Maintenance regimen for chronic constipation is using Miralax 1 capful daily   Severe constipation: can use over the counter magnesium citrate (use sparingly)    Foot pain:  Voltaren Gel - can apply up to 4 times a day  Heating pad  Ice packs  Tylenol  as needed   Knee arthritis:  Call [facility] to schedule appt

## 2024-01-14 ENCOUNTER — Ambulatory Visit: Payer: Self-pay | Admitting: Internal Medicine

## 2024-01-17 NOTE — Telephone Encounter (Signed)
 Copied from CRM #[id number]. Topic: Clinical - Lab/Test Results >> Jan 17, 2024 10:54 AM Adelita E wrote: Reason for CRM: Patient called regarding lab results, relayed note that PCP attached with results and patient did not have further questions.

## 2024-02-21 ENCOUNTER — Ambulatory Visit: Admitting: Internal Medicine

## 2024-02-21 NOTE — Progress Notes (Unsigned)
 " [facility] [address] [place] [address] Dept: [phone] Dept Fax: [phone]    Subjective:   Breanna Jones 20-Oct-1960 02/21/2024  No chief complaint on file.   HPI: Breanna Jones presents today for re-assessment and management of chronic medical conditions.     The following portions of the patient's history were reviewed and updated as appropriate: past medical history, past surgical history, family history, social history, allergies, medications, and problem list.   Patient Active Problem List   Diagnosis Date Noted   Obstructive sleep apnea syndrome 08/22/2023   Constipation 08/22/2023   Allergic rhinitis 08/22/2023   Dysphagia 04/04/2023   Morbid obesity (HCC) 04/04/2023   Sensorineural hearing loss (SNHL), bilateral 04/10/2019   Tinnitus of both ears 04/10/2019   Essential hypertension 03/21/2018   Prediabetes 03/21/2018   Vitamin D  deficiency 03/21/2018   Chronic midline low back pain without sciatica 11/15/2016   Gastroesophageal reflux disease 11/15/2016   Mild intermittent asthma without complication 11/15/2016   Moderate asthma with acute exacerbation 11/15/2016   Past Medical History:  Diagnosis Date   Arthritis    back   Asthma    Hypertension    Seasonal allergies    Sleep apnea    Past Surgical History:  Procedure Laterality Date   bunion removal     CESAREAN SECTION     CHOLECYSTECTOMY N/A 05/02/2019   Procedure: LAPAROSCOPIC CHOLECYSTECTOMY;  Surgeon: Vanderbilt Ned, MD;  Location: [facility];  Service: General;  Laterality: N/A;   No family history on file. Current Medications[1] Allergies[2]   ROS: A complete ROS was performed with pertinent positives/negatives noted in the HPI. The remainder of the ROS are negative.    Objective:   There were no vitals filed for this visit.  GENERAL: Well-appearing, in NAD. Well nourished.  SKIN: Pink, warm and  dry. No rash, lesion, ulceration, or ecchymoses.  NECK: Trachea midline. Full ROM w/o pain or tenderness. No lymphadenopathy.  RESPIRATORY: Chest wall symmetrical. Respirations even and non-labored. Breath sounds clear to auscultation bilaterally.  CARDIAC: S1, S2 present, regular rate and rhythm. Peripheral pulses 2+ bilaterally.  MSK: Muscle tone and strength appropriate for age. Joints w/o tenderness, redness, or swelling.  EXTREMITIES: Without clubbing, cyanosis, or edema.  NEUROLOGIC: No motor or sensory deficits. Steady, even gait.  PSYCH/MENTAL STATUS: Alert, oriented x 3. Cooperative, appropriate mood and affect.   Health Maintenance Due  Topic Date Due   Bone Density Scan  Never done   COVID-19 Vaccine (3 - Pfizer risk series) 12/11/2019   Mammogram  04/12/2024    No results found for any visits on 02/21/24.  The 10-year ASCVD risk score (Arnett DK, et al., 2019) is: 4.6%     Assessment & Plan:    There are no diagnoses linked to this encounter. No orders of the defined types were placed in this encounter.  No images are attached to the encounter or orders placed in the encounter. No orders of the defined types were placed in this encounter.   No follow-ups on file.   Rosina Senters, FNP    [1]  Current Outpatient Medications:    albuterol (VENTOLIN HFA) 108 (90 Base) MCG/ACT inhaler, 2 puff as needed Inhalation every 6 hrs; Duration: 90 days, Disp: , Rfl:    amLODipine (NORVASC) 5 MG tablet, Take 5 mg by mouth daily., Disp: , Rfl:    cetirizine (ZYRTEC) 10 MG tablet, Take 10 mg by mouth daily., Disp: ,  Rfl:    Ferrous Sulfate (IRON PO), Take by mouth once a week., Disp: , Rfl:    montelukast (SINGULAIR) 10 MG tablet, Take 10 mg by mouth daily., Disp: , Rfl:    omeprazole (PRILOSEC) 10 MG capsule, Take 20 mg by mouth daily. (Patient not taking: Reported on 12/25/2023), Disp: , Rfl:    Polyethylene Glycol 3350 (MIRALAX PO), Take by mouth., Disp: , Rfl:    Probiotic  Product (PROBIOTIC DAILY PO), Take by mouth., Disp: , Rfl:    QVAR REDIHALER 40 MCG/ACT inhaler, Inhale 1 puff into the lungs daily., Disp: , Rfl:    Vitamin D , Ergocalciferol , (DRISDOL ) 1.25 MG (50000 UNIT) CAPS capsule, Take 50,000 Units by mouth every 7 (seven) days., Disp: , Rfl:  [2]  Allergies Allergen Reactions   Ibuprofen  Anaphylaxis   Lisinopril Swelling   Other Anaphylaxis    Fruit   Peanuts [Peanut Oil] Anaphylaxis   Shellfish Allergy Swelling   Brassica Oleracea Other (See Comments)    Other Reaction(s): felt like pins were sticking into her body   Nutritional Supplements Hives and Itching    Fresh fruits- can only eat apples, oranges, grapes   Bacitracin-Polymyxin B Swelling   "

## 2024-02-21 NOTE — Progress Notes (Unsigned)
 " [facility] [address] [place] [address] Dept: [phone] Dept Fax: [phone]    Subjective:   Breanna Jones 12/06/1960 02/22/2024  No chief complaint on file.   HPI: Breanna Jones presents today for re-assessment and management of chronic medical conditions.     The following portions of the patient's history were reviewed and updated as appropriate: past medical history, past surgical history, family history, social history, allergies, medications, and problem list.   Patient Active Problem List   Diagnosis Date Noted   Obstructive sleep apnea syndrome 08/22/2023   Constipation 08/22/2023   Allergic rhinitis 08/22/2023   Dysphagia 04/04/2023   Morbid obesity (HCC) 04/04/2023   Sensorineural hearing loss (SNHL), bilateral 04/10/2019   Tinnitus of both ears 04/10/2019   Essential hypertension 03/21/2018   Prediabetes 03/21/2018   Vitamin D  deficiency 03/21/2018   Chronic midline low back pain without sciatica 11/15/2016   Gastroesophageal reflux disease 11/15/2016   Mild intermittent asthma without complication 11/15/2016   Moderate asthma with acute exacerbation 11/15/2016   Past Medical History:  Diagnosis Date   Arthritis    back   Asthma    Hypertension    Seasonal allergies    Sleep apnea    Past Surgical History:  Procedure Laterality Date   bunion removal     CESAREAN SECTION     CHOLECYSTECTOMY N/A 05/02/2019   Procedure: LAPAROSCOPIC CHOLECYSTECTOMY;  Surgeon: Vanderbilt Ned, MD;  Location: [facility];  Service: General;  Laterality: N/A;   No family history on file. Current Medications[1] Allergies[2]   ROS: A complete ROS was performed with pertinent positives/negatives noted in the HPI. The remainder of the ROS are negative.    Objective:   There were no vitals filed for this visit.  GENERAL: Well-appearing, in NAD. Well nourished.  SKIN: Pink, warm and  dry. No rash, lesion, ulceration, or ecchymoses.  NECK: Trachea midline. Full ROM w/o pain or tenderness. No lymphadenopathy.  RESPIRATORY: Chest wall symmetrical. Respirations even and non-labored. Breath sounds clear to auscultation bilaterally.  CARDIAC: S1, S2 present, regular rate and rhythm. Peripheral pulses 2+ bilaterally.  MSK: Muscle tone and strength appropriate for age. Joints w/o tenderness, redness, or swelling.  EXTREMITIES: Without clubbing, cyanosis, or edema.  NEUROLOGIC: No motor or sensory deficits. Steady, even gait.  PSYCH/MENTAL STATUS: Alert, oriented x 3. Cooperative, appropriate mood and affect.   Health Maintenance Due  Topic Date Due   Bone Density Scan  Never done   COVID-19 Vaccine (3 - Pfizer risk series) 12/11/2019   Mammogram  04/12/2024    No results found for any visits on 02/22/24.  The 10-year ASCVD risk score (Arnett DK, et al., 2019) is: 4.6%     Assessment & Plan:    There are no diagnoses linked to this encounter. No orders of the defined types were placed in this encounter.  No images are attached to the encounter or orders placed in the encounter. No orders of the defined types were placed in this encounter.   No follow-ups on file.   Rosina Senters, FNP     [1]  Current Outpatient Medications:    albuterol (VENTOLIN HFA) 108 (90 Base) MCG/ACT inhaler, 2 puff as needed Inhalation every 6 hrs; Duration: 90 days, Disp: , Rfl:    amLODipine (NORVASC) 5 MG tablet, Take 5 mg by mouth daily., Disp: , Rfl:    cetirizine (ZYRTEC) 10 MG tablet, Take 10 mg by mouth daily., Disp: ,  Rfl:    Ferrous Sulfate (IRON PO), Take by mouth once a week., Disp: , Rfl:    montelukast (SINGULAIR) 10 MG tablet, Take 10 mg by mouth daily., Disp: , Rfl:    omeprazole (PRILOSEC) 10 MG capsule, Take 20 mg by mouth daily. (Patient not taking: Reported on 12/25/2023), Disp: , Rfl:    Polyethylene Glycol 3350 (MIRALAX PO), Take by mouth., Disp: , Rfl:    Probiotic  Product (PROBIOTIC DAILY PO), Take by mouth., Disp: , Rfl:    QVAR REDIHALER 40 MCG/ACT inhaler, Inhale 1 puff into the lungs daily., Disp: , Rfl:    Vitamin D , Ergocalciferol , (DRISDOL ) 1.25 MG (50000 UNIT) CAPS capsule, Take 50,000 Units by mouth every 7 (seven) days., Disp: , Rfl:  [2]  Allergies Allergen Reactions   Ibuprofen  Anaphylaxis   Lisinopril Swelling   Other Anaphylaxis    Fruit   Peanuts [Peanut Oil] Anaphylaxis   Shellfish Allergy Swelling   Brassica Oleracea Other (See Comments)    Other Reaction(s): felt like pins were sticking into her body   Nutritional Supplements Hives and Itching    Fresh fruits- can only eat apples, oranges, grapes   Bacitracin-Polymyxin B Swelling   "

## 2024-02-22 ENCOUNTER — Encounter: Payer: Self-pay | Admitting: Internal Medicine

## 2024-02-22 ENCOUNTER — Ambulatory Visit: Admitting: Internal Medicine

## 2024-02-22 VITALS — BP 120/80 | HR 87 | Temp 98.9°F | Ht 65.0 in | Wt 230.2 lb

## 2024-02-22 DIAGNOSIS — E559 Vitamin D deficiency, unspecified: Secondary | ICD-10-CM | POA: Diagnosis not present

## 2024-02-22 DIAGNOSIS — K5900 Constipation, unspecified: Secondary | ICD-10-CM | POA: Diagnosis not present

## 2024-02-22 DIAGNOSIS — G4733 Obstructive sleep apnea (adult) (pediatric): Secondary | ICD-10-CM | POA: Diagnosis not present

## 2024-02-22 MED ORDER — VITAMIN D (ERGOCALCIFEROL) 1.25 MG (50000 UNIT) PO CAPS: 50000.0000 [IU] | ORAL_CAPSULE | ORAL | 2 refills | Status: AC

## 2024-02-28 ENCOUNTER — Encounter: Payer: Self-pay | Admitting: Gastroenterology

## 2024-03-27 ENCOUNTER — Ambulatory Visit: Admitting: Gastroenterology

## 2024-06-26 ENCOUNTER — Ambulatory Visit: Admitting: Internal Medicine
